# Patient Record
Sex: Female | Born: 1965 | Race: White | Hispanic: No | Marital: Single | Smoking: Current every day smoker
Health system: Southern US, Community
[De-identification: ages and names within clinical notes are randomized; demographics above are authoritative.]

## PROBLEM LIST (undated history)

## (undated) DIAGNOSIS — G8929 Other chronic pain: Secondary | ICD-10-CM

## (undated) DIAGNOSIS — M549 Dorsalgia, unspecified: Secondary | ICD-10-CM

## (undated) DIAGNOSIS — I10 Essential (primary) hypertension: Secondary | ICD-10-CM

## (undated) HISTORY — PX: ABDOMINAL HYSTERECTOMY: SHX81

## (undated) HISTORY — PX: CHOLECYSTECTOMY: SHX55

---

## 1999-04-12 ENCOUNTER — Other Ambulatory Visit: Admission: RE | Admit: 1999-04-12 | Discharge: 1999-04-12 | Payer: Self-pay | Admitting: Obstetrics and Gynecology

## 2009-05-25 ENCOUNTER — Ambulatory Visit (HOSPITAL_COMMUNITY): Admission: RE | Admit: 2009-05-25 | Discharge: 2009-05-25 | Payer: Self-pay | Admitting: Family Medicine

## 2012-10-08 ENCOUNTER — Emergency Department (HOSPITAL_COMMUNITY)
Admission: EM | Admit: 2012-10-08 | Discharge: 2012-10-08 | Disposition: A | Discharge status: Home / Self Care | Payer: Self-pay | Attending: Emergency Medicine | Admitting: Emergency Medicine

## 2012-10-08 ENCOUNTER — Encounter (HOSPITAL_COMMUNITY): Payer: Self-pay | Admitting: *Deleted

## 2012-10-08 DIAGNOSIS — F17200 Nicotine dependence, unspecified, uncomplicated: Secondary | ICD-10-CM | POA: Insufficient documentation

## 2012-10-08 DIAGNOSIS — G8929 Other chronic pain: Secondary | ICD-10-CM | POA: Insufficient documentation

## 2012-10-08 DIAGNOSIS — Z90710 Acquired absence of both cervix and uterus: Secondary | ICD-10-CM | POA: Insufficient documentation

## 2012-10-08 DIAGNOSIS — R1110 Vomiting, unspecified: Secondary | ICD-10-CM

## 2012-10-08 DIAGNOSIS — Z9089 Acquired absence of other organs: Secondary | ICD-10-CM | POA: Insufficient documentation

## 2012-10-08 DIAGNOSIS — R112 Nausea with vomiting, unspecified: Secondary | ICD-10-CM | POA: Insufficient documentation

## 2012-10-08 HISTORY — DX: Dorsalgia, unspecified: M54.9

## 2012-10-08 HISTORY — DX: Other chronic pain: G89.29

## 2012-10-08 LAB — COMPREHENSIVE METABOLIC PANEL
Albumin: 3.2 g/dL — ABNORMAL LOW (ref 3.5–5.2)
BUN: <3 mg/dL — ABNORMAL LOW (ref 6–23)
Calcium: 8.6 mg/dL (ref 8.4–10.5)
Chloride: 100 mEq/L (ref 96–112)
Creatinine, Ser: 0.54 mg/dL (ref 0.50–1.10)
GFR calc non Af Amer: >90 mL/min (ref >90)
Total Bilirubin: 0.4 mg/dL (ref 0.3–1.2)

## 2012-10-08 LAB — CBC WITH DIFFERENTIAL/PLATELET
Basophils Relative: 0 % (ref 0–1)
Eosinophils Absolute: 0.1 10*3/uL (ref 0.0–0.7)
Eosinophils Relative: 1 % (ref 0–5)
HCT: 38.2 % (ref 36.0–46.0)
Hemoglobin: 13.5 g/dL (ref 12.0–15.0)
MCH: 35.1 pg — ABNORMAL HIGH (ref 26.0–34.0)
MCHC: 35.3 g/dL (ref 30.0–36.0)
MCV: 99.2 fL (ref 78.0–100.0)
Monocytes Absolute: 0.6 10*3/uL (ref 0.1–1.0)
Monocytes Relative: 8 % (ref 3–12)
Neutro Abs: 4.5 10*3/uL (ref 1.7–7.7)

## 2012-10-08 LAB — URINALYSIS, ROUTINE W REFLEX MICROSCOPIC
Glucose, UA: NEGATIVE mg/dL
Leukocytes, UA: NEGATIVE
Protein, ur: NEGATIVE mg/dL
Urobilinogen, UA: 0.2 mg/dL (ref 0.0–1.0)

## 2012-10-08 MED ORDER — SODIUM CHLORIDE 0.9 % IV BOLUS (SEPSIS)
1000.0000 mL | Freq: Once | INTRAVENOUS | Status: AC
  Administered 2012-10-08: 1000 mL via INTRAVENOUS

## 2012-10-08 MED ORDER — HYDROMORPHONE HCL PF 1 MG/ML IJ SOLN
1.0000 mg | Freq: Once | INTRAMUSCULAR | Status: AC
  Administered 2012-10-08: 1 mg via INTRAVENOUS
  Filled 2012-10-08: qty 1

## 2012-10-08 MED ORDER — ONDANSETRON HCL 4 MG/2ML IJ SOLN
4.0000 mg | Freq: Once | INTRAMUSCULAR | Status: AC
  Administered 2012-10-08: 4 mg via INTRAVENOUS
  Filled 2012-10-08: qty 2

## 2012-10-08 MED ORDER — POTASSIUM CHLORIDE CRYS ER 20 MEQ PO TBCR
40.0000 meq | EXTENDED_RELEASE_TABLET | Freq: Once | ORAL | Status: AC
  Administered 2012-10-08: 40 meq via ORAL
  Filled 2012-10-08 (×2): qty 2

## 2012-10-08 NOTE — ED Notes (Signed)
Patient request soft drink,nurse notified given upon request.

## 2012-10-08 NOTE — ED Provider Notes (Signed)
History    This chart was scribed for Erika Lennert, MD by Marlyne Beards, ED Scribe. The patient was seen in room APA06/APA06. Patient's care was started at 8:59 PM.    CSN: [id number]  Arrival date & time 10/08/12  2039   First MD Initiated Contact with Patient 10/08/12 2059      No chief complaint on file.   (Consider location/radiation/quality/duration/timing/severity/associated sxs/prior treatment) Patient is a 47 y.o. female presenting with abdominal pain. The history is provided by the patient. No language interpreter was used.  Abdominal Pain Pain location:  RLQ and LLQ Pain severity:  Moderate Onset quality:  Gradual Timing:  Constant Chronicity:  New Relieved by:  Nothing Associated symptoms: nausea and vomiting   Associated symptoms: no chest pain and no diarrhea    Erika Rodriguez is a 47 y.o. female who presents to the Emergency Department complaining of moderate constant nausea with associated episodes of emesis and abdominal pain onset since Saturday. Pt states she went to [facility] last week due to sx's where a CT scan and Ultra sound were done but no diagnosis was found. Pt went back today and was sent here for further evaluation. Pt came in today due to her sx's becoming worse with moderate constant abdominal pain being the chief complaint. Pt states she had multiple episodes of emesis earlier today pta. Pt denies fever, chills, cough, diarrhea, SOB, weakness, and any other associated symptoms. Pt has medical hx of abdominal hysterectomy and cholecystectomy. Pt is a current everyday tobacco smoker (1pack/day).  Past Medical History  Diagnosis Date  . Chronic back pain     Past Surgical History  Procedure Laterality Date  . Abdominal hysterectomy    . Cholecystectomy      History reviewed. No pertinent family history.  History  Substance Use Topics  . Smoking status: Current Every Day Smoker -- 1.00 packs/day  . Smokeless tobacco: Not on file  . Alcohol  Use: Yes     Comment: occasional    OB History   Grav Para Term Preterm Abortions TAB SAB Ect Mult Living                  Review of Systems  Cardiovascular: Negative for chest pain.  Gastrointestinal: Positive for nausea, vomiting and abdominal pain. Negative for diarrhea.    Allergies  Review of patient's allergies indicates no known allergies.  Home Medications  No current outpatient prescriptions on file.  BP 169/90  Pulse 77  Temp(Src) 98.4 F (36.9 C) (Oral)  Resp 20  Ht 5\' 4"  (1.626 m)  Wt 180 lb (81.647 kg)  BMI 30.88 kg/m2  SpO2 100%  Physical Exam  Nursing note and vitals reviewed. Constitutional: She is oriented to person, place, and time. She appears well-developed.  HENT:  Head: Normocephalic.  Eyes: Conjunctivae and EOM are normal. No scleral icterus.  Neck: Neck supple. No thyromegaly present.  Cardiovascular: Normal rate and regular rhythm.  Exam reveals no gallop and no friction rub.   No murmur heard. Pulmonary/Chest: No stridor. She has no wheezes. She has no rales. She exhibits no tenderness.  Abdominal: She exhibits no distension. There is tenderness. There is no rebound.  Mild tenderness to RLQ and LLQ of abdomen.  Musculoskeletal: Normal range of motion. She exhibits no edema.  Lymphadenopathy:    She has no cervical adenopathy.  Neurological: She is oriented to person, place, and time. Coordination normal.  Skin: No rash noted. No erythema.  Psychiatric: She has  a normal mood and affect. Her behavior is normal.    ED Course  Procedures (including critical care time) DIAGNOSTIC STUDIES: Oxygen Saturation is 100% on room air, normal by my interpretation.    COORDINATION OF CARE: 9:05 PM Discussed ED treatment with pt and pt agrees.  10:52 PM Discussed lab results with pt and pt agrees.   Labs Reviewed  CBC WITH DIFFERENTIAL - Abnormal; Notable for the following:    RBC 3.85 (*)    MCH 35.1 (*)    All other components within normal  limits  COMPREHENSIVE METABOLIC PANEL - Abnormal; Notable for the following:    Potassium 3.1 (*)    Glucose, Bld 106 (*)    BUN <3 (*)    Total Protein 5.9 (*)    Albumin 3.2 (*)    All other components within normal limits  URINALYSIS, ROUTINE W REFLEX MICROSCOPIC   No results found.   No diagnosis found.    MDM   The chart was scribed for me under my direct supervision.  I personally performed the history, physical, and medical decision making and all procedures in the evaluation of this patient.Erika Lennert, MD 10/08/12 2258

## 2012-10-08 NOTE — ED Notes (Signed)
Pt reports nausea and vomiting since Saturday with generalized abdominal pain.  Reports was admitted at [facility], discharged yesterday.  Went back today with same symptoms, reports multiple tests done with no specific diagnosis.

## 2012-10-22 ENCOUNTER — Encounter: Payer: Self-pay | Admitting: Gastroenterology

## 2012-10-22 ENCOUNTER — Ambulatory Visit (INDEPENDENT_AMBULATORY_CARE_PROVIDER_SITE_OTHER): Payer: Self-pay | Admitting: Gastroenterology

## 2012-10-22 VITALS — BP 155/105 | HR 97 | Temp 98.4°F | Ht 64.0 in | Wt 183.8 lb

## 2012-10-22 DIAGNOSIS — K921 Melena: Secondary | ICD-10-CM

## 2012-10-22 DIAGNOSIS — R1084 Generalized abdominal pain: Secondary | ICD-10-CM

## 2012-10-22 DIAGNOSIS — K625 Hemorrhage of anus and rectum: Secondary | ICD-10-CM

## 2012-10-22 DIAGNOSIS — R112 Nausea with vomiting, unspecified: Secondary | ICD-10-CM | POA: Insufficient documentation

## 2012-10-22 DIAGNOSIS — G8929 Other chronic pain: Secondary | ICD-10-CM

## 2012-10-22 NOTE — Patient Instructions (Addendum)
We have scheduled you for an upper endoscopy with Dr. Jena Gauss. Please see separate instructions. Take Dexilant one capsule daily before breakfast. Samples provided. Go to ER if you develop fever, worsening abdominal pain, intractable vomiting.

## 2012-10-22 NOTE — Progress Notes (Signed)
No PCP on File 

## 2012-10-22 NOTE — Assessment & Plan Note (Signed)
Recommend colonoscopy at a later date. At this time I do not feel that should be aware tolerate the bowel prep. She is in agreement.

## 2012-10-22 NOTE — Progress Notes (Signed)
Primary Care Physician:  No primary provider on file.  Primary Gastroenterologist:  Roetta Sessions, MD   Chief Complaint  Patient presents with  . Abdominal Pain  . Nausea    HPI:  Erika Rodriguez is a 47 y.o. female here for further evaluation of recent acute onset abdominal pain associated with nausea and vomiting. Symptoms began on 10/04/2012. She was admitted at [facility] from 10/04/2012 to 10/07/2012 for abdominal pain with, N/V. It was felt that she had gastritis. Her LFTs, CBC were unremarkable. Lipase was normal. She also had negative cardiac enzymes x1. CT of the abdomen without contrast showed an old compression fracture at T11 but no acute findings to explain her symptoms. Acute abdominal films were negative. Abdominal ultrasound showed 5.5 mm common bile duct, status post cholecystectomy. Did not see gastroenterologist. After discharge on 10/07/2012, she states she went back to the emergency department the next day and was treated and released. Due to ongoing symptoms she decided to go to [facility] ER when she left the ER [facility].  Still complains of abdominal pain. When symptoms started the pain was in the epigastrium and radiated into chest. She thought she was having a MI. Symptoms have migrated to the lower abdomin and into the back. Vomiting bile and food. No hematemesis. Last episode of vomiting on Saturday. Unable to eat much since then. BM usually 1-2 per week chronically. Sometimes hard. Occasional brbpr. Melena while in the hospital. No ibuprofen prior to going to hospital. Intermittent Aleve for chronic back pain. Prescribed Reglan 10 mg 4 times a day, protonix 40 mg daily daily. Patient stopped taking the medication as she did not feel it was helping.  New problem of being unable to taste or smell.   Intentional 90 pound weight loss, maximum weight of 270 pounds with dietary changes. She does not eat meat.  Current Outpatient Prescriptions  Medication Sig  Dispense Refill  . acetaminophen (TYLENOL) 500 MG tablet Take 500 mg by mouth every 4 (four) hours as needed for pain.      Marland Kitchen ibuprofen (ADVIL,MOTRIN) 400 MG tablet Take 400 mg by mouth every 6 (six) hours as needed for pain.      . hydrOXYzine (ATARAX/VISTARIL) 25 MG tablet Take 25 mg by mouth every 6 (six) hours as needed for anxiety.      . metoCLOPramide (REGLAN) 10 MG tablet Take 10 mg by mouth 4 (four) times daily.      . pantoprazole (PROTONIX) 40 MG tablet Take 40 mg by mouth daily.       No current facility-administered medications for this visit.    Allergies as of 10/22/2012  . (No Known Allergies)    Past Medical History  Diagnosis Date  . Chronic back pain     Past Surgical History  Procedure Laterality Date  . Abdominal hysterectomy    . Cholecystectomy      Family History  Problem Relation Age of Onset  . Ulcers Neg Hx   . Liver disease Neg Hx   . Colon cancer Neg Hx   . Crohn's disease Neg Hx   . Ulcerative colitis Neg Hx     History   Social History  . Marital Status: Single    Spouse Name: N/A    Number of Children: 0  . Years of Education: N/A   Occupational History  . Merchandizing    Social History Main Topics  . Smoking status: Current Every Day Smoker -- 1.00 packs/day  .  Smokeless tobacco: Not on file  . Alcohol Use: Yes     Comment: occasional  . Drug Use: No  . Sexually Active: Not on file   Other Topics Concern  . Not on file   Social History Narrative  . No narrative on file      ROS:  General: 10 pound weight loss with illness. Negative for anorexia, fever, chills, fatigue, weakness. Eyes: Negative for vision changes.  ENT: Negative for hoarseness, difficulty swallowing , nasal congestion. CV: Negative for chest pain, angina, palpitations, dyspnea on exertion, peripheral edema.  Respiratory: Negative for dyspnea at rest, dyspnea on exertion, cough, sputum, wheezing.  GI: See history of present illness. GU:  Negative for  dysuria, hematuria, urinary incontinence, urinary frequency, nocturnal urination.  MS: Negative for joint pain. Chronic low back pain unchanged.  Derm: Negative for rash or itching.  Neuro: Negative for weakness, abnormal sensation, seizure, frequent headaches, memory loss, confusion.  Psych: Negative for anxiety, depression, suicidal ideation, hallucinations.  Endo: Negative for unusual weight change.  Heme: Negative for bruising or bleeding. Allergy: Negative for rash or hives.    Physical Examination:  BP 155/105  Pulse 97  Temp(Src) 98.4 F (36.9 C) (Oral)  Ht 5\' 4"  (1.626 m)  Wt 183 lb 12.8 oz (83.371 kg)  BMI 31.53 kg/m2   General: Well-nourished, well-developed in no acute distress.  Head: Normocephalic, atraumatic.   Eyes: Conjunctiva pink, no icterus. Mouth: Oropharyngeal mucosa moist and pink , no lesions erythema or exudate. Neck: Supple without thyromegaly, masses, or lymphadenopathy.  Lungs: Clear to auscultation bilaterally.  Heart: Regular rate and rhythm, no murmurs rubs or gallops.  Abdomen: Bowel sounds are normal, diffuse mild tenderness, no focal pain, nondistended, no hepatosplenomegaly or masses, no abdominal bruits or    hernia , no rebound or guarding.   Rectal: not performed Extremities: No lower extremity edema. No clubbing or deformities.  Neuro: Alert and oriented x 4 , grossly normal neurologically.  Skin: Warm and dry, no rash or jaundice.   Psych: Alert and cooperative, normal mood and affect.  Labs: Lab Results  Component Value Date   WBC 7.6 10/08/2012   HGB 13.5 10/08/2012   HCT 38.2 10/08/2012   MCV 99.2 10/08/2012   PLT 217 10/08/2012   Lab Results  Component Value Date   CREATININE 0.54 10/08/2012   BUN <3* 10/08/2012   NA 138 10/08/2012   K 3.1* 10/08/2012   CL 100 10/08/2012   CO2 27 10/08/2012   Lab Results  Component Value Date   ALT 20 10/08/2012   AST 14 10/08/2012   ALKPHOS 46 10/08/2012   BILITOT 0.4 10/08/2012   Labs from 10/04/2012. Total  bilirubin 0.5, alkaline phosphatase 48, AST 19, ALT 18, albumin 3.9, amylase 72, lipase 28, creatinine 0.43, white blood cell count 7100, hemoglobin 14.7, hematocrit 42.5, MCV 101.6, platelets 206,000  Imaging Studies: CT of the abdomen without contrast on 10/07/2012 was unremarkable. Abdominal ultrasound 10/06/2012, gallbladder surgically absent. Common bile duct 5.5 mm. Nothing to explain abdominal pain.

## 2012-10-22 NOTE — Addendum Note (Signed)
Addended by: Tiffany Kocher on: 10/22/2012 04:47 PM   Modules accepted: Level of Service

## 2012-10-22 NOTE — Assessment & Plan Note (Signed)
47 year old lady with a several week history of acute onset abdominal pain associated with nausea and vomiting. Hospitalization at [facility] as outlined above with unremarkable noncontrast CT of abdomen and abdominal ultrasound. Labs unrevealing. Seemed to get better with PPI therapy during that hospital stay according to discharge summary. She reports having an episode of melena recently. Continues to have daily abdominal pain, generalized on exam. No vomiting a couple days but unable to eat. Takes Aleve intermittently for chronic pain.   Discussed with patient regarding upper endoscopy given history of melena, persistent vomiting, NSAID use. Need to rule out gastritis, PUD.  I have discussed the risks, alternatives, benefits with regards to but not limited to the risk of reaction to medication, bleeding, infection, perforation and the patient is agreeable to proceed. Written consent to be obtained.  Her abdominal pain is mostly generalized at this point. Likely in part due to prolonged heaving. Doubt we are dealing with evolving appendicitis, given repeatedly normal WBC, afebrile over the past two weeks. If she were to develop worsening abd pain, intractable vomiting, fever she should go to ER.

## 2012-10-23 ENCOUNTER — Encounter (HOSPITAL_COMMUNITY): Payer: Self-pay | Admitting: Pharmacy Technician

## 2012-10-24 NOTE — Progress Notes (Signed)
Forgot to address constipation at OV. Let pt know she should try Miralax 17g daily until regular BMs then daily prn.  She also needs to see PCP for elevated blood pressure.

## 2012-10-29 NOTE — Progress Notes (Signed)
Pt is aware.  

## 2012-11-05 ENCOUNTER — Ambulatory Visit (HOSPITAL_COMMUNITY)
Admission: RE | Admit: 2012-11-05 | Discharge: 2012-11-05 | Disposition: A | Discharge status: Home / Self Care | Payer: Self-pay | Source: Ambulatory Visit | Attending: Internal Medicine | Admitting: Internal Medicine

## 2012-11-05 ENCOUNTER — Encounter (HOSPITAL_COMMUNITY)
Admission: RE | Disposition: A | Discharge status: Home / Self Care | Payer: Self-pay | Source: Ambulatory Visit | Attending: Internal Medicine

## 2012-11-05 ENCOUNTER — Encounter (HOSPITAL_COMMUNITY): Payer: Self-pay | Admitting: *Deleted

## 2012-11-05 DIAGNOSIS — K625 Hemorrhage of anus and rectum: Secondary | ICD-10-CM

## 2012-11-05 DIAGNOSIS — R1084 Generalized abdominal pain: Secondary | ICD-10-CM

## 2012-11-05 DIAGNOSIS — R109 Unspecified abdominal pain: Secondary | ICD-10-CM

## 2012-11-05 DIAGNOSIS — R112 Nausea with vomiting, unspecified: Secondary | ICD-10-CM

## 2012-11-05 DIAGNOSIS — K449 Diaphragmatic hernia without obstruction or gangrene: Secondary | ICD-10-CM

## 2012-11-05 DIAGNOSIS — R110 Nausea: Secondary | ICD-10-CM | POA: Insufficient documentation

## 2012-11-05 DIAGNOSIS — K209 Esophagitis, unspecified without bleeding: Secondary | ICD-10-CM | POA: Insufficient documentation

## 2012-11-05 DIAGNOSIS — K2960 Other gastritis without bleeding: Secondary | ICD-10-CM

## 2012-11-05 DIAGNOSIS — K921 Melena: Secondary | ICD-10-CM

## 2012-11-05 HISTORY — PX: ESOPHAGOGASTRODUODENOSCOPY: SHX5428

## 2012-11-05 LAB — HEPATIC FUNCTION PANEL: Bilirubin, Direct: <0.1 mg/dL (ref 0.0–0.3)

## 2012-11-05 LAB — LIPASE, BLOOD: Lipase: 41 U/L (ref 11–59)

## 2012-11-05 SURGERY — EGD (ESOPHAGOGASTRODUODENOSCOPY)
Anesthesia: Moderate Sedation

## 2012-11-05 MED ORDER — STERILE WATER FOR IRRIGATION IR SOLN
Status: DC | PRN
  Administered 2012-11-05: 14:00:00

## 2012-11-05 MED ORDER — SODIUM CHLORIDE 0.9 % IV SOLN
INTRAVENOUS | Status: DC
  Administered 2012-11-05: 13:00:00 via INTRAVENOUS

## 2012-11-05 MED ORDER — PROMETHAZINE HCL 25 MG/ML IJ SOLN
INTRAMUSCULAR | Status: DC | PRN
  Administered 2012-11-05: 25 mg via INTRAVENOUS

## 2012-11-05 MED ORDER — ONDANSETRON HCL 4 MG/2ML IJ SOLN
INTRAMUSCULAR | Status: AC
  Filled 2012-11-05: qty 2

## 2012-11-05 MED ORDER — MIDAZOLAM HCL 5 MG/5ML IJ SOLN
INTRAMUSCULAR | Status: DC | PRN
  Administered 2012-11-05: 1 mg via INTRAVENOUS
  Administered 2012-11-05 (×3): 2 mg via INTRAVENOUS

## 2012-11-05 MED ORDER — MEPERIDINE HCL 100 MG/ML IJ SOLN
INTRAMUSCULAR | Status: AC
  Filled 2012-11-05: qty 2

## 2012-11-05 MED ORDER — MEPERIDINE HCL 100 MG/ML IJ SOLN
INTRAMUSCULAR | Status: DC | PRN
  Administered 2012-11-05 (×3): 50 mg via INTRAVENOUS

## 2012-11-05 MED ORDER — SODIUM CHLORIDE 0.9 % IJ SOLN
INTRAMUSCULAR | Status: AC
  Filled 2012-11-05: qty 10

## 2012-11-05 MED ORDER — PROMETHAZINE HCL 25 MG/ML IJ SOLN
INTRAMUSCULAR | Status: AC
  Filled 2012-11-05: qty 1

## 2012-11-05 MED ORDER — ONDANSETRON HCL 4 MG/2ML IJ SOLN
INTRAMUSCULAR | Status: DC | PRN
  Administered 2012-11-05: 4 mg via INTRAVENOUS

## 2012-11-05 MED ORDER — MIDAZOLAM HCL 5 MG/5ML IJ SOLN
INTRAMUSCULAR | Status: AC
  Filled 2012-11-05: qty 10

## 2012-11-05 MED ORDER — BUTAMBEN-TETRACAINE-BENZOCAINE 2-2-14 % EX AERO
INHALATION_SPRAY | CUTANEOUS | Status: DC | PRN
  Administered 2012-11-05: 2 via TOPICAL

## 2012-11-05 NOTE — H&P (View-Only) (Signed)
Primary Care Physician:  No primary provider on file.  Primary Gastroenterologist:  Michael Rourk, MD   Chief Complaint  Patient presents with  . Abdominal Pain  . Nausea    HPI:  Erika Rodriguez is a 47 y.o. female here for further evaluation of recent acute onset abdominal pain associated with nausea and vomiting. Symptoms began on 10/04/2012. She was admitted at [facility] from 10/04/2012 to 10/07/2012 for abdominal pain with, N/V. It was felt that she had gastritis. Her LFTs, CBC were unremarkable. Lipase was normal. She also had negative cardiac enzymes x1. CT of the abdomen without contrast showed an old compression fracture at T11 but no acute findings to explain her symptoms. Acute abdominal films were negative. Abdominal ultrasound showed 5.5 mm common bile duct, status post cholecystectomy. Did not see gastroenterologist. After discharge on 10/07/2012, she states she went back to the emergency department the next day and was treated and released. Due to ongoing symptoms she decided to go to [facility] ER when she left the ER [facility].  Still complains of abdominal pain. When symptoms started the pain was in the epigastrium and radiated into chest. She thought she was having a MI. Symptoms have migrated to the lower abdomin and into the back. Vomiting bile and food. No hematemesis. Last episode of vomiting on Saturday. Unable to eat much since then. BM usually 1-2 per week chronically. Sometimes hard. Occasional brbpr. Melena while in the hospital. No ibuprofen prior to going to hospital. Intermittent Aleve for chronic back pain. Prescribed Reglan 10 mg 4 times a day, protonix 40 mg daily daily. Patient stopped taking the medication as she did not feel it was helping.  New problem of being unable to taste or smell.   Intentional 90 pound weight loss, maximum weight of 270 pounds with dietary changes. She does not eat meat.  Current Outpatient Prescriptions  Medication Sig  Dispense Refill  . acetaminophen (TYLENOL) 500 MG tablet Take 500 mg by mouth every 4 (four) hours as needed for pain.      . ibuprofen (ADVIL,MOTRIN) 400 MG tablet Take 400 mg by mouth every 6 (six) hours as needed for pain.      . hydrOXYzine (ATARAX/VISTARIL) 25 MG tablet Take 25 mg by mouth every 6 (six) hours as needed for anxiety.      . metoCLOPramide (REGLAN) 10 MG tablet Take 10 mg by mouth 4 (four) times daily.      . pantoprazole (PROTONIX) 40 MG tablet Take 40 mg by mouth daily.       No current facility-administered medications for this visit.    Allergies as of 10/22/2012  . (No Known Allergies)    Past Medical History  Diagnosis Date  . Chronic back pain     Past Surgical History  Procedure Laterality Date  . Abdominal hysterectomy    . Cholecystectomy      Family History  Problem Relation Age of Onset  . Ulcers Neg Hx   . Liver disease Neg Hx   . Colon cancer Neg Hx   . Crohn's disease Neg Hx   . Ulcerative colitis Neg Hx     History   Social History  . Marital Status: Single    Spouse Name: N/A    Number of Children: 0  . Years of Education: N/A   Occupational History  . Merchandizing    Social History Main Topics  . Smoking status: Current Every Day Smoker -- 1.00 packs/day  .   Smokeless tobacco: Not on file  . Alcohol Use: Yes     Comment: occasional  . Drug Use: No  . Sexually Active: Not on file   Other Topics Concern  . Not on file   Social History Narrative  . No narrative on file      ROS:  General: 10 pound weight loss with illness. Negative for anorexia, fever, chills, fatigue, weakness. Eyes: Negative for vision changes.  ENT: Negative for hoarseness, difficulty swallowing , nasal congestion. CV: Negative for chest pain, angina, palpitations, dyspnea on exertion, peripheral edema.  Respiratory: Negative for dyspnea at rest, dyspnea on exertion, cough, sputum, wheezing.  GI: See history of present illness. GU:  Negative for  dysuria, hematuria, urinary incontinence, urinary frequency, nocturnal urination.  MS: Negative for joint pain. Chronic low back pain unchanged.  Derm: Negative for rash or itching.  Neuro: Negative for weakness, abnormal sensation, seizure, frequent headaches, memory loss, confusion.  Psych: Negative for anxiety, depression, suicidal ideation, hallucinations.  Endo: Negative for unusual weight change.  Heme: Negative for bruising or bleeding. Allergy: Negative for rash or hives.    Physical Examination:  BP 155/105  Pulse 97  Temp(Src) 98.4 F (36.9 C) (Oral)  Ht 5' 4" (1.626 m)  Wt 183 lb 12.8 oz (83.371 kg)  BMI 31.53 kg/m2   General: Well-nourished, well-developed in no acute distress.  Head: Normocephalic, atraumatic.   Eyes: Conjunctiva pink, no icterus. Mouth: Oropharyngeal mucosa moist and pink , no lesions erythema or exudate. Neck: Supple without thyromegaly, masses, or lymphadenopathy.  Lungs: Clear to auscultation bilaterally.  Heart: Regular rate and rhythm, no murmurs rubs or gallops.  Abdomen: Bowel sounds are normal, diffuse mild tenderness, no focal pain, nondistended, no hepatosplenomegaly or masses, no abdominal bruits or    hernia , no rebound or guarding.   Rectal: not performed Extremities: No lower extremity edema. No clubbing or deformities.  Neuro: Alert and oriented x 4 , grossly normal neurologically.  Skin: Warm and dry, no rash or jaundice.   Psych: Alert and cooperative, normal mood and affect.  Labs: Lab Results  Component Value Date   WBC 7.6 10/08/2012   HGB 13.5 10/08/2012   HCT 38.2 10/08/2012   MCV 99.2 10/08/2012   PLT 217 10/08/2012   Lab Results  Component Value Date   CREATININE 0.54 10/08/2012   BUN <3* 10/08/2012   NA 138 10/08/2012   K 3.1* 10/08/2012   CL 100 10/08/2012   CO2 27 10/08/2012   Lab Results  Component Value Date   ALT 20 10/08/2012   AST 14 10/08/2012   ALKPHOS 46 10/08/2012   BILITOT 0.4 10/08/2012   Labs from 10/04/2012. Total  bilirubin 0.5, alkaline phosphatase 48, AST 19, ALT 18, albumin 3.9, amylase 72, lipase 28, creatinine 0.43, white blood cell count 7100, hemoglobin 14.7, hematocrit 42.5, MCV 101.6, platelets 206,000  Imaging Studies: CT of the abdomen without contrast on 10/07/2012 was unremarkable. Abdominal ultrasound 10/06/2012, gallbladder surgically absent. Common bile duct 5.5 mm. Nothing to explain abdominal pain.    

## 2012-11-05 NOTE — Interval H&P Note (Signed)
History and Physical Interval Note:  11/05/2012 2:01 PM  Erika Rodriguez  has presented today for surgery, with the diagnosis of NAUSEA  AND VOMITING RECTAL BLEED ABDOMINAL PAIN MELENA  The various methods of treatment have been discussed with the patient and family. After consideration of risks, benefits and other options for treatment, the patient has consented to  Procedure(s) with comments: ESOPHAGOGASTRODUODENOSCOPY (EGD) (N/A) - 1:30 as a surgical intervention .  The patient's history has been reviewed, patient examined, no change in status, stable for surgery.  I have reviewed the patient's chart and labs.  Questions were answered to the patient's satisfaction.     EGD per plan.The risks, benefits, limitations, alternatives and imponderables have been reviewed with the patient. Potential for esophageal dilation, biopsy, etc. have also been reviewed.  Questions have been answered. All parties agreeable.   [name]

## 2012-11-05 NOTE — Op Note (Signed)
[facility] [address] [place], [address]   ENDOSCOPY PROCEDURE REPORT  PATIENT: Erika Rodriguez, Erika Rodriguez  MR#: [id number] BIRTHDATE: 1966/03/30 , 46  yrs. old GENDER: Female ENDOSCOPIST: R.  Roetta Sessions, MD FACP [facility] REFERRED BY:      none PROCEDURE DATE:  11/05/2012 PROCEDURE:     EGD with esophageal, gastric and duodenal biopsy  INDICATIONS:      abdominal pain  INFORMED CONSENT:   The risks, benefits, limitations, alternatives and imponderables have been discussed.  The potential for biopsy, esophogeal dilation, etc. have also been reviewed.  Questions have been answered.  All parties agreeable.  Please see the history and physical in the medical record for more information.  MEDICATIONS:    Demerol 150 mg IV and Versed 7 mg IV. Phenergan 25 mg IV and Zofran 4 mg IV  DESCRIPTION OF PROCEDURE:   The [id number]-[id number] ([id number])  endoscope was introduced through the mouth and advanced to the second portion of the duodenum without difficulty or limitations.  The mucosal surfaces were surveyed very carefully during advancement of the scope and upon withdrawal.  Retroflexion view of the proximal stomach and esophagogastric junction was performed.      FINDINGS:  2 cm skinny "tongue" of salmon -colored epithelium- distal esophagus; otherwise, esophageal mucosa appeared normal. Stomach empty. Subtle Mottling of the Gastric Mucosa with Focal Superficial Antral Erosions. No Ulcer or Infiltrating Process. Small Hiatal Hernia. Patent Pylorus. Examination of Bulb, Second and Third Portion Revealed some injection of the mucosa and fine erosions of the duodenal mucosa as well.. No Scalloping.  THERAPEUTIC / DIAGNOSTIC MANEUVERS PERFORMED:  Biopsies the abnormal-appearing esophagus, stomach and duodenum were taken for histologic study   COMPLICATIONS:  None  IMPRESSION:  Hiatal hernia. Subtle mucosal abdomen obese of the esophagus, stomach and duodenum-of uncertain  significance-status post biopsies   RECOMMENDATIONS:   Followup on pathology. Serum lipase and repeat hepatic function profile today    _______________________________ R. Roetta Sessions, MD FACP [facility] eSigned:  R. Roetta Sessions, MD FACP [facility] 11/05/2012 2:42 PM     CC:  PATIENT NAME:  Erika Rodriguez, Erika Rodriguez MR#: [id number]

## 2012-11-09 ENCOUNTER — Encounter: Payer: Self-pay | Admitting: Internal Medicine

## 2012-11-10 ENCOUNTER — Encounter (HOSPITAL_COMMUNITY): Payer: Self-pay | Admitting: Internal Medicine

## 2012-11-10 NOTE — Progress Notes (Unsigned)
Letter from: Corbin Ade  Reason for Letter: Results Review Send letter to patient.  Send copy of letter with path to referring provider and PCP.  Nee ov w extender in 10-12 weeks to set up tcs

## 2012-11-10 NOTE — Progress Notes (Unsigned)
Letter mailed to pt.  

## 2012-11-12 LAB — CBC
ALT: 18 U/L (ref 7–35)
AST: 19 U/L
Alkaline Phosphatase: 48 U/L
Amylase: 72 units/L (ref 25–110)
Glucose: 114

## 2012-11-12 LAB — LIPASE
HCT: 43 %
MCV: 1001.6 fL

## 2012-11-20 NOTE — Progress Notes (Signed)
Pt is aware of OV on 7/22 at 0945 with AS

## 2012-11-21 ENCOUNTER — Encounter: Payer: Self-pay | Admitting: Gastroenterology

## 2013-01-19 ENCOUNTER — Encounter: Payer: Self-pay | Admitting: Internal Medicine

## 2013-01-20 ENCOUNTER — Telehealth: Payer: Self-pay | Admitting: Gastroenterology

## 2013-01-20 ENCOUNTER — Ambulatory Visit: Payer: Self-pay | Admitting: Gastroenterology

## 2013-01-20 NOTE — Telephone Encounter (Signed)
Pt was a no show

## 2013-01-26 ENCOUNTER — Encounter: Payer: Self-pay | Admitting: Gastroenterology

## 2013-01-26 NOTE — Telephone Encounter (Signed)
Please send note for f/u 

## 2013-01-26 NOTE — Telephone Encounter (Signed)
Mailed letter for pt to call our office to [facility] her OV

## 2013-02-12 ENCOUNTER — Emergency Department (HOSPITAL_COMMUNITY)
Admission: EM | Admit: 2013-02-12 | Discharge: 2013-02-12 | Disposition: A | Discharge status: Home / Self Care | Payer: Self-pay | Attending: Emergency Medicine | Admitting: Emergency Medicine

## 2013-02-12 ENCOUNTER — Encounter (HOSPITAL_COMMUNITY): Payer: Self-pay | Admitting: Emergency Medicine

## 2013-02-12 DIAGNOSIS — R209 Unspecified disturbances of skin sensation: Secondary | ICD-10-CM | POA: Insufficient documentation

## 2013-02-12 DIAGNOSIS — M549 Dorsalgia, unspecified: Secondary | ICD-10-CM

## 2013-02-12 DIAGNOSIS — I10 Essential (primary) hypertension: Secondary | ICD-10-CM | POA: Insufficient documentation

## 2013-02-12 DIAGNOSIS — G8929 Other chronic pain: Secondary | ICD-10-CM | POA: Insufficient documentation

## 2013-02-12 DIAGNOSIS — M545 Low back pain, unspecified: Secondary | ICD-10-CM | POA: Insufficient documentation

## 2013-02-12 DIAGNOSIS — M5430 Sciatica, unspecified side: Secondary | ICD-10-CM | POA: Insufficient documentation

## 2013-02-12 DIAGNOSIS — F17200 Nicotine dependence, unspecified, uncomplicated: Secondary | ICD-10-CM | POA: Insufficient documentation

## 2013-02-12 HISTORY — DX: Essential (primary) hypertension: I10

## 2013-02-12 MED ORDER — HYDROCODONE-ACETAMINOPHEN 5-325 MG PO TABS: 2.0000 | ORAL_TABLET | ORAL | Status: DC | PRN

## 2013-02-12 MED ORDER — DEXAMETHASONE SODIUM PHOSPHATE 10 MG/ML IJ SOLN
10.0000 mg | Freq: Once | INTRAMUSCULAR | Status: AC
  Administered 2013-02-12: 10 mg via INTRAMUSCULAR
  Filled 2013-02-12: qty 1

## 2013-02-12 MED ORDER — KETOROLAC TROMETHAMINE 60 MG/2ML IM SOLN
60.0000 mg | Freq: Once | INTRAMUSCULAR | Status: AC
  Administered 2013-02-12: 60 mg via INTRAMUSCULAR
  Filled 2013-02-12: qty 2

## 2013-02-12 MED ORDER — NAPROXEN 500 MG PO TABS: 500.0000 mg | ORAL_TABLET | Freq: Two times a day (BID) | ORAL | Status: DC

## 2013-02-12 NOTE — ED Provider Notes (Signed)
CSN: [id number]     Arrival date & time 02/12/13  0840 History  This chart was scribed for Vida Roller, MD by Bennett Scrape, ED Scribe. This patient was seen in room APA04/APA04 and the patient's care was started at 9:08 AM.   Chief Complaint  Patient presents with  . Back Pain  . Numbness    The history is provided by the patient. No language interpreter was used.    HPI Comments: Erika Rodriguez is a 47 y.o. female who presents to the Emergency Department complaining of 20 years of intermittent lower back pain that has started radiating down bilateral posterior legs over the past 3 months. She states that this flare up started around 4 AM this morning when she got up awkwardly. She reports that the last episode this severe occurred 6 months ago. The pain is located across the diffuse lower back and described as a stabbing sensation that again radiates down bilateral posterior legs. She reports intermittent numbness in bilateral legs with ambulation for the past 3 months as well and denies changes. She states that she usually takes Aleve for her symptoms but has not taken anything today. She denies having any prior back surgeries. She denies fever, difficulty urinating, urinary frequency and bowel or urinary incontinence as associated symptoms. She denies illegal drug use or h/o CA. Pt states that she was told that she had HTN during her last ED visit but has not followed up since.    Past Medical History  Diagnosis Date  . Chronic back pain   . Hypertension    Past Surgical History  Procedure Laterality Date  . Abdominal hysterectomy    . Cholecystectomy    . Esophagogastroduodenoscopy N/A 11/05/2012    [id number] hernia. Subtle mucosal abdomen obese of the esophagus, stomach and duodenum-of uncertain significance-s/p bx   Family History  Problem Relation Age of Onset  . Ulcers Neg Hx   . Liver disease Neg Hx   . Colon cancer Neg Hx   . Crohn's disease Neg Hx   . Ulcerative  colitis Neg Hx   . Diabetes Mother   . Heart failure Mother   . Stroke Sister   . Epilepsy Sister    History  Substance Use Topics  . Smoking status: Current Every Day Smoker -- 0.50 packs/day for 20 years    Types: Cigarettes  . Smokeless tobacco: Never Used  . Alcohol Use: No   OB History   Grav Para Term Preterm Abortions TAB SAB Ect Mult Living   1    1  1    0     Review of Systems  A complete 10 system review of systems was obtained and all systems are negative except as noted in the HPI and PMH.   Allergies  Review of patient's allergies indicates no known allergies.  Home Medications   Current Outpatient Rx  Name  Route  Sig  Dispense  Refill  . acetaminophen (TYLENOL) 500 MG tablet   Oral   Take 500 mg by mouth every 4 (four) hours as needed for pain.         Marland Kitchen ibuprofen (ADVIL,MOTRIN) 400 MG tablet   Oral   Take 400 mg by mouth every 6 (six) hours as needed for pain.         Marland Kitchen HYDROcodone-acetaminophen (NORCO/VICODIN) 5-325 MG per tablet   Oral   Take 2 tablets by mouth every 4 (four) hours as needed for pain.   20 tablet  0   . naproxen (NAPROSYN) 500 MG tablet   Oral   Take 1 tablet (500 mg total) by mouth 2 (two) times daily with a meal.   30 tablet   0    Triage Vitals: BP 131/93  Pulse 94  Temp(Src) 98.4 F (36.9 C) (Oral)  Resp 20  Ht 5\' 3"  (1.6 m)  Wt 183 lb (83.008 kg)  BMI 32.43 kg/m2  SpO2 99%  Physical Exam  Nursing note and vitals reviewed. Constitutional: She is oriented to person, place, and time. She appears well-developed and well-nourished. No distress.  HENT:  Head: Normocephalic and atraumatic.  Eyes: EOM are normal.  Neck: Neck supple. No tracheal deviation present.  Cardiovascular: Normal rate and regular rhythm.   Pulmonary/Chest: Effort normal and breath sounds normal. No respiratory distress.  Musculoskeletal: Normal range of motion.  Passive SLR bilaterally reproduces pain at 45 degrees of hip flexion -  relieved with knee flexion  Neurological: She is alert and oriented to person, place, and time. She has normal reflexes.  Sensation to light touch and pin prick was intact grossly, subjective asymmetry on the left, 5/5 strength in bilateral lower extremities and hips, appropriate and normal patellar reflexes bilaterally  Skin: Skin is warm and dry.  Psychiatric: She has a normal mood and affect. Her behavior is normal.    ED Course   DIAGNOSTIC STUDIES: Oxygen Saturation is 99% on room air, normal by my interpretation.    COORDINATION OF CARE: 9:14 AM-Discussed discharge plan which includes pain medications that are safe to drive with and prescriptions for stronger pain medications with pt at bedside and pt agreed to plan. Advised pt that she needs to f/u with PCP for HTN and back specialist and pt agreed. Addressed symptoms to return for.  Procedures (including critical care time)  Labs Reviewed - No data to display No results found. 1. Back pain   2. Sciatica, unspecified laterality     MDM  Though the pt is in pain, she has no focal neurological defecits and has been able to drive herself to the ED and ambulate into the hospital, she has no numbness and can straight leg raise though passive straight leg raise does cause pain bilaterally - this raises some concern for disc disease and herniation however she at this point is amenable to conservative therapy with steroid / Toradol and hydrocodone / apap as outpt with NS f/u.  Her VS show no fevers and she has no other pathological red flags for back pain.  She has no urinary incontinence, no fevers, no IVDU, no CA history.    She has expressed her understanding and indications for f/u.  Meds given in ED:  Medications  ketorolac (TORADOL) injection 60 mg (not administered)  dexamethasone (DECADRON) injection 10 mg (not administered)    New Prescriptions   HYDROCODONE-ACETAMINOPHEN (NORCO/VICODIN) 5-325 MG PER TABLET    Take 2  tablets by mouth every 4 (four) hours as needed for pain.   NAPROXEN (NAPROSYN) 500 MG TABLET    Take 1 tablet (500 mg total) by mouth 2 (two) times daily with a meal.    I personally performed the services described in this documentation, which was scribed in my presence. The recorded information has been reviewed and is accurate.    Vida Roller, MD 02/12/13 [phone]

## 2013-02-12 NOTE — Progress Notes (Signed)
ED/CM noted pt did not have health insurance, and/or PCP. Patient was given the ED [place] uninsured handout with information for the clinics, food pantries, and the handout for insurance sign-up. Patient expressed appreciation for this.      

## 2013-02-12 NOTE — ED Notes (Signed)
Patient c/o lower back pain with numbness and tingling in both legs. Per patient also lost taste and smell. Patient reports injuring back 27 years ago at work and having "flare-ups" with back pain since. Patient states she woke with "her back out" this morning. Patient reports numbness and tingling in legs with loss of smell and taste starting 3 months ago. Patient also states "now to think of it my eye sight is getting blurry to."

## 2013-02-12 NOTE — ED Notes (Signed)
Pt states prior to receiving toradol shot that toradol doesn't usually help her.  Pt tearful, rates pain at 10.  Notified Dr. Hyacinth Meeker but no further orders at this time.

## 2013-03-04 ENCOUNTER — Emergency Department (HOSPITAL_COMMUNITY)
Admission: EM | Admit: 2013-03-04 | Discharge: 2013-03-04 | Disposition: A | Discharge status: Home / Self Care | Payer: Self-pay | Attending: Emergency Medicine | Admitting: Emergency Medicine

## 2013-03-04 ENCOUNTER — Encounter (HOSPITAL_COMMUNITY): Payer: Self-pay | Admitting: *Deleted

## 2013-03-04 DIAGNOSIS — I10 Essential (primary) hypertension: Secondary | ICD-10-CM | POA: Insufficient documentation

## 2013-03-04 DIAGNOSIS — R55 Syncope and collapse: Secondary | ICD-10-CM | POA: Insufficient documentation

## 2013-03-04 DIAGNOSIS — M545 Low back pain, unspecified: Secondary | ICD-10-CM | POA: Insufficient documentation

## 2013-03-04 DIAGNOSIS — F17200 Nicotine dependence, unspecified, uncomplicated: Secondary | ICD-10-CM | POA: Insufficient documentation

## 2013-03-04 DIAGNOSIS — M79609 Pain in unspecified limb: Secondary | ICD-10-CM | POA: Insufficient documentation

## 2013-03-04 DIAGNOSIS — Z79899 Other long term (current) drug therapy: Secondary | ICD-10-CM | POA: Insufficient documentation

## 2013-03-04 DIAGNOSIS — R110 Nausea: Secondary | ICD-10-CM | POA: Insufficient documentation

## 2013-03-04 DIAGNOSIS — G8929 Other chronic pain: Secondary | ICD-10-CM | POA: Insufficient documentation

## 2013-03-04 LAB — URINALYSIS, ROUTINE W REFLEX MICROSCOPIC
Ketones, ur: NEGATIVE mg/dL
Leukocytes, UA: NEGATIVE
Nitrite: NEGATIVE
Protein, ur: NEGATIVE mg/dL
Urobilinogen, UA: 0.2 mg/dL (ref 0.0–1.0)

## 2013-03-04 LAB — GLUCOSE, CAPILLARY: Glucose-Capillary: 109 mg/dL — ABNORMAL HIGH (ref 70–99)

## 2013-03-04 MED ORDER — NAPROXEN 500 MG PO TABS: 500.0000 mg | ORAL_TABLET | Freq: Two times a day (BID) | ORAL | Status: DC

## 2013-03-04 MED ORDER — HYDROMORPHONE HCL PF 2 MG/ML IJ SOLN
2.0000 mg | Freq: Once | INTRAMUSCULAR | Status: AC
  Administered 2013-03-04: 2 mg via INTRAMUSCULAR
  Filled 2013-03-04: qty 1

## 2013-03-04 MED ORDER — KETOROLAC TROMETHAMINE 60 MG/2ML IM SOLN
60.0000 mg | Freq: Once | INTRAMUSCULAR | Status: AC
  Administered 2013-03-04: 60 mg via INTRAMUSCULAR
  Filled 2013-03-04: qty 2

## 2013-03-04 MED ORDER — HYDROCODONE-ACETAMINOPHEN 5-325 MG PO TABS: 1.0000 | ORAL_TABLET | ORAL | Status: DC | PRN

## 2013-03-04 MED ORDER — ONDANSETRON 4 MG PO TBDP
4.0000 mg | ORAL_TABLET | Freq: Once | ORAL | Status: AC
  Administered 2013-03-04: 4 mg via ORAL
  Filled 2013-03-04: qty 1

## 2013-03-04 NOTE — ED Provider Notes (Signed)
CSN: [id number]     Arrival date & time 03/04/13  1025 History   First MD Initiated Contact with Patient 03/04/13 1152     Chief Complaint  Patient presents with  . Back Pain  . Leg Pain  . Nausea  . Near Syncope   (Consider location/radiation/quality/duration/timing/severity/associated sxs/prior Treatment) HPI Pt with hx of chronic back pain presents with worsening of her low back pain in left lower back.  Some radiation down her left leg.  No numbness or weakness of lower leg.  Pt states that the medications she was given several weeks ago for similar pain did help and that her back was improving until the past several days.  Denies having difficulty urinating, no incontinence of bowel or bladder, no fevers.  No specific injury or trauma.  However she has been helping a family member that is in rehab at this time. Movement and palpation make symptoms worse.   There are no other associated systemic symptoms, there are no other alleviating or modifying factors.   Past Medical History  Diagnosis Date  . Chronic back pain   . Hypertension    Past Surgical History  Procedure Laterality Date  . Abdominal hysterectomy    . Cholecystectomy    . Esophagogastroduodenoscopy N/A 11/05/2012    [id number] hernia. Subtle mucosal abdomen obese of the esophagus, stomach and duodenum-of uncertain significance-s/p bx   Family History  Problem Relation Age of Onset  . Ulcers Neg Hx   . Liver disease Neg Hx   . Colon cancer Neg Hx   . Crohn's disease Neg Hx   . Ulcerative colitis Neg Hx   . Diabetes Mother   . Heart failure Mother   . Stroke Sister   . Epilepsy Sister    History  Substance Use Topics  . Smoking status: Current Every Day Smoker -- 0.50 packs/day for 20 years    Types: Cigarettes  . Smokeless tobacco: Never Used  . Alcohol Use: No   OB History   Grav Para Term Preterm Abortions TAB SAB Ect Mult Living   1    1  1    0     Review of Systems ROS reviewed and all otherwise  negative except for mentioned in HPI  Allergies  Review of patient's allergies indicates no known allergies.  Home Medications   Current Outpatient Rx  Name  Route  Sig  Dispense  Refill  . ibuprofen (ADVIL,MOTRIN) 400 MG tablet   Oral   Take 400 mg by mouth every 6 (six) hours as needed for pain.         . naproxen sodium (ANAPROX) 220 MG tablet   Oral   Take 440 mg by mouth 2 (two) times daily as needed (for pain).          . Probiotic Product (PROBIOTIC DAILY PO)   Oral   Take 1 capsule by mouth daily.         Marland Kitchen HYDROcodone-acetaminophen (NORCO/VICODIN) 5-325 MG per tablet   Oral   Take 1 tablet by mouth every 4 (four) hours as needed for pain.   15 tablet   0   . naproxen (NAPROSYN) 500 MG tablet   Oral   Take 1 tablet (500 mg total) by mouth 2 (two) times daily.   30 tablet   0    BP 139/94  Pulse 78  Temp(Src) 97.1 F (36.2 C) (Oral)  Resp 18  SpO2 100% Vitals reviewed Physical Exam Physical Examination: General appearance -  alert, well appearing, and in no distress Mental status - alert, oriented to person, place, and time Eyes - no conjunctival injection, no scleral icterus Chest - clear to auscultation, no wheezes, rales or rhonchi, symmetric air entry Heart - normal rate, regular rhythm, normal S1, S2, no murmurs, rubs, clicks or gallops Abdomen - soft, nontender, nondistended, no masses or organomegaly Back exam - full range of motion, no midline tenderness to palpation, some right sided paraspinous tenderness, pain reproduced with straight leg raise and 45 degrees, but no radiation down left lower extremity Neurological - alert, oriented, normal speech, strength 5/5 in extremities x 4, sensation intact Extremities - peripheral pulses normal, no pedal edema, no clubbing or cyanosis Skin - normal coloration and turgor, no rashes  ED Course  Procedures (including critical care time) Labs Review Labs Reviewed  GLUCOSE, CAPILLARY - Abnormal;  Notable for the following:    Glucose-Capillary 109 (*)    All other components within normal limits  URINALYSIS, ROUTINE W REFLEX MICROSCOPIC - Abnormal; Notable for the following:    APPearance HAZY (*)    All other components within normal limits   Imaging Review No results found.  MDM   1. Low back pain    Pt presenting with an increase in her chronic low back pain.  She denies any new injury.  No signs or symptoms of cauda equina or epidural abscess.  Pt requests " a shot and get me out of here".  She has been working on getting f/u appointment with Dr. Newell Coral, neurosurgery.  Pt felt improved after meds in the ED and was anxious for discharge.  Discharged with strict return precautions.  Pt agreeable with plan.    Ethelda Chick, MD 03/04/13 [phone]

## 2013-03-04 NOTE — ED Notes (Signed)
Pt has had lower back problems for a while and today with pain radiating down leg.  Pt states she is nauseated and feels like she is going to pass out.

## 2013-03-04 NOTE — ED Notes (Signed)
Pt reports lower abdominal pain too

## 2013-03-04 NOTE — ED Notes (Signed)
Pt reports that she was driving her sister to rehab when she felt her "back go out." Pt reports that she started having "tingling and pain" in pt left leg.  Pt reports that she was at [facility] a month ago and was given "two shots" and a referral. Pt has appt with Dr. Bettina Gavia. Pt axo X4, NAD noted at this time.

## 2013-03-11 ENCOUNTER — Emergency Department (HOSPITAL_COMMUNITY)
Admission: EM | Admit: 2013-03-11 | Discharge: 2013-03-11 | Disposition: A | Discharge status: Home / Self Care | Payer: Self-pay | Attending: Emergency Medicine | Admitting: Emergency Medicine

## 2013-03-11 ENCOUNTER — Encounter (HOSPITAL_COMMUNITY): Payer: Self-pay

## 2013-03-11 DIAGNOSIS — K5900 Constipation, unspecified: Secondary | ICD-10-CM | POA: Insufficient documentation

## 2013-03-11 DIAGNOSIS — G8929 Other chronic pain: Secondary | ICD-10-CM

## 2013-03-11 DIAGNOSIS — F17200 Nicotine dependence, unspecified, uncomplicated: Secondary | ICD-10-CM | POA: Insufficient documentation

## 2013-03-11 DIAGNOSIS — Y9389 Activity, other specified: Secondary | ICD-10-CM | POA: Insufficient documentation

## 2013-03-11 DIAGNOSIS — IMO0002 Reserved for concepts with insufficient information to code with codable children: Secondary | ICD-10-CM | POA: Insufficient documentation

## 2013-03-11 DIAGNOSIS — I10 Essential (primary) hypertension: Secondary | ICD-10-CM | POA: Insufficient documentation

## 2013-03-11 DIAGNOSIS — Y9289 Other specified places as the place of occurrence of the external cause: Secondary | ICD-10-CM | POA: Insufficient documentation

## 2013-03-11 DIAGNOSIS — X500XXA Overexertion from strenuous movement or load, initial encounter: Secondary | ICD-10-CM | POA: Insufficient documentation

## 2013-03-11 DIAGNOSIS — Z79899 Other long term (current) drug therapy: Secondary | ICD-10-CM | POA: Insufficient documentation

## 2013-03-11 MED ORDER — CYCLOBENZAPRINE HCL 10 MG PO TABS: 10.0000 mg | ORAL_TABLET | Freq: Three times a day (TID) | ORAL | Status: DC | PRN

## 2013-03-11 MED ORDER — NAPROXEN 500 MG PO TABS: 500.0000 mg | ORAL_TABLET | Freq: Two times a day (BID) | ORAL | Status: DC

## 2013-03-11 MED ORDER — DIAZEPAM 5 MG/ML IJ SOLN
10.0000 mg | Freq: Once | INTRAMUSCULAR | Status: AC
  Administered 2013-03-11: 10 mg via INTRAMUSCULAR
  Filled 2013-03-11: qty 2

## 2013-03-11 MED ORDER — TRAMADOL HCL 50 MG PO TABS: 100.0000 mg | ORAL_TABLET | Freq: Four times a day (QID) | ORAL | Status: DC | PRN

## 2013-03-11 MED ORDER — KETOROLAC TROMETHAMINE 60 MG/2ML IM SOLN
60.0000 mg | Freq: Once | INTRAMUSCULAR | Status: AC
  Administered 2013-03-11: 60 mg via INTRAMUSCULAR
  Filled 2013-03-11: qty 2

## 2013-03-11 NOTE — ED Notes (Signed)
Pt reports back pain for 20 years, re-injured her back yesterday helping her sister in the bathtub, waiting on appointment w/ ortho md, is out of pain meds.

## 2013-03-11 NOTE — ED Notes (Signed)
Patient states ride will be here at 1030. Patient understands that RN must see ride prior to discharge.

## 2013-03-11 NOTE — ED Notes (Signed)
Ambulatory to room, steady gait. No distress.

## 2013-03-11 NOTE — ED Notes (Signed)
Patient with no complaints at this time. Respirations even and unlabored. Skin warm/dry. Discharge instructions reviewed with patient at this time. Patient given opportunity to voice concerns/ask questions. Patient discharged at this time and left Emergency Department with steady gait with family member at side

## 2013-03-11 NOTE — ED Provider Notes (Signed)
CSN: [id number]     Arrival date & time 03/11/13  4098 History  This chart was scribed for Ward Givens, MD by Quintella Reichert, ED scribe.  This patient was seen in room APA10/APA10 and the patient's care was started at 9:06 AM.    Chief Complaint  Patient presents with  . Back Pain    The history is provided by the patient. No language interpreter was used.    HPI Comments: Erika Rodriguez is a 47 y.o. female who presents to the Emergency Department complaining of sudden acute exacerbation of her chronic back pain.  Pt states she has had back pain intermittently for 20 years.  Yesterday while she was trying to lift her sister who had a stroke on Aug 3rd out of the bath tub yesterday she suddenly developed severe pain to her left lower back that has grown worse since onset.  Pain radiates into her posterior upper left leg.  It is exacerbated by walking and bending over and is relieved by staying still.  She has attempted to treat pain with Aleve, without relief.  She called neurosurgeon (Dr. Newell Coral) but has not been called back yet.  Pt also reports chronic unchanged constipation.  She denies urinary or bowel incontinence.  Pt states that her last back pain flare-up was 3 years ago.  She has no h/o back surgery.  Pt is a current half-pack-per-day smoker and former one-pack-per-day smoker.  She is not working presently and is not on disability.  She is not married and stays with her sister and her husband.  Her sister had a sroke on 02/01/13 and pt has been taking care of her since then.  Pt has no PCP   Past Medical History  Diagnosis Date  . Chronic back pain   . Hypertension     Past Surgical History  Procedure Laterality Date  . Abdominal hysterectomy    . Cholecystectomy    . Esophagogastroduodenoscopy N/A 11/05/2012    [id number] hernia. Subtle mucosal abdomen obese of the esophagus, stomach and duodenum-of uncertain significance-s/p bx    Family History  Problem Relation Age of  Onset  . Ulcers Neg Hx   . Liver disease Neg Hx   . Colon cancer Neg Hx   . Crohn's disease Neg Hx   . Ulcerative colitis Neg Hx   . Diabetes Mother   . Heart failure Mother   . Stroke Sister   . Epilepsy Sister     History  Substance Use Topics  . Smoking status: Current Every Day Smoker -- 0.50 packs/day for 20 years    Types: Cigarettes  . Smokeless tobacco: Never Used  . Alcohol Use: No  unemployed Down to 1/2 ppd from 1 ppd  OB History   Grav Para Term Preterm Abortions TAB SAB Ect Mult Living   1    1  1    0      Review of Systems  Gastrointestinal: Positive for constipation (chronic, at baseline). Negative for diarrhea.  Genitourinary:       No incontinence  Musculoskeletal: Positive for back pain.     Allergies  Review of patient's allergies indicates no known allergies.  Home Medications   Current Outpatient Rx  Name  Route  Sig  Dispense  Refill  . HYDROcodone-acetaminophen (NORCO/VICODIN) 5-325 MG per tablet   Oral   Take 1 tablet by mouth every 4 (four) hours as needed for pain.   15 tablet   0   .  naproxen sodium (ANAPROX) 220 MG tablet   Oral   Take 440 mg by mouth 2 (two) times daily as needed (for pain).          . Probiotic Product (PROBIOTIC DAILY PO)   Oral   Take 1 capsule by mouth daily.          BP 153/93  Pulse 92  Temp(Src) 98 F (36.7 C) (Oral)  Resp 22  Ht 5\' 4"  (1.626 m)  Wt 183 lb (83.008 kg)  BMI 31.4 kg/m2  SpO2 99%  Physical Exam  Nursing note and vitals reviewed. Constitutional: She is oriented to person, place, and time. She appears well-developed and well-nourished.  Non-toxic appearance. She does not appear ill. No distress.  HENT:  Head: Normocephalic and atraumatic.  Right Ear: External ear normal.  Left Ear: External ear normal.  Nose: Nose normal. No mucosal edema or rhinorrhea.  Mouth/Throat: Oropharynx is clear and moist and mucous membranes are normal. No dental abscesses or edematous.  Eyes:  Conjunctivae and EOM are normal. Pupils are equal, round, and reactive to light.  Neck: Normal range of motion and full passive range of motion without pain. Neck supple.  Cardiovascular: Normal rate, regular rhythm and normal heart sounds.  Exam reveals no gallop and no friction rub.   No murmur heard. Pulmonary/Chest: Effort normal and breath sounds normal. No respiratory distress. She has no wheezes. She has no rhonchi. She has no rales. She exhibits no tenderness and no crepitus.  Abdominal: Soft. Normal appearance and bowel sounds are normal. She exhibits no distension. There is no tenderness. There is no rebound and no guarding.  Musculoskeletal: She exhibits tenderness. She exhibits no edema.       Lumbar back: She exhibits tenderness.       Back:  Tender diffusely to entire lumbar spine. Lumbar paraspinal pain with ROM to left, more severe on left than the right  Neurological: She is alert and oriented to person, place, and time. She has normal strength and normal reflexes. No cranial nerve deficit.  Patellar Reflexes 2+ and equal Straight leg raise negative bilaterally  Skin: Skin is warm, dry and intact. No rash noted. No erythema. No pallor.  Psychiatric: She has a normal mood and affect. Her speech is normal and behavior is normal. Her mood appears not anxious.    ED Course  Procedures (including critical care time) Medications  ketorolac (TORADOL) injection 60 mg (60 mg Intramuscular Given 03/11/13 0920)  diazepam (VALIUM) injection 10 mg (10 mg Intramuscular Given 03/11/13 0920)     DIAGNOSTIC STUDIES: Oxygen Saturation is 99% on room air, normal by my interpretation.    COORDINATION OF CARE: 9:15 AM-Discussed treatment plan which includes pain medication and f/u with pt at bedside and pt agreed to plan.   This is her 3rd ED visit for her back pain since Aug 14th. Review of the [facility] [place] data base only shows narcotic prescriptions from the ED for those visits.   MDM   1.  Acute exacerbation of chronic low back pain    New Prescriptions   CYCLOBENZAPRINE (FLEXERIL) 10 MG TABLET    Take 1 tablet (10 mg total) by mouth 3 (three) times daily as needed for muscle spasms.   NAPROXEN (NAPROSYN) 500 MG TABLET    Take 1 tablet (500 mg total) by mouth 2 (two) times daily.   TRAMADOL (ULTRAM) 50 MG TABLET    Take 2 tablets (100 mg total) by mouth every 6 (six) hours as  needed.    Plan discharge   Devoria Albe, MD, FACEP   I personally performed the services described in this documentation, which was scribed in my presence. The recorded information has been reviewed and considered.  Devoria Albe, MD, FACEP   Ward Givens, MD 03/11/13 [phone]

## 2013-05-07 ENCOUNTER — Other Ambulatory Visit: Payer: Self-pay

## 2013-10-12 ENCOUNTER — Encounter (HOSPITAL_COMMUNITY): Payer: Self-pay | Admitting: Emergency Medicine

## 2013-10-12 ENCOUNTER — Emergency Department (HOSPITAL_COMMUNITY): Payer: Self-pay

## 2013-10-12 ENCOUNTER — Emergency Department (HOSPITAL_COMMUNITY)
Admission: EM | Admit: 2013-10-12 | Discharge: 2013-10-12 | Disposition: A | Discharge status: Home / Self Care | Payer: Self-pay | Attending: Emergency Medicine | Admitting: Emergency Medicine

## 2013-10-12 DIAGNOSIS — L0390 Cellulitis, unspecified: Secondary | ICD-10-CM

## 2013-10-12 DIAGNOSIS — Y939 Activity, unspecified: Secondary | ICD-10-CM | POA: Insufficient documentation

## 2013-10-12 DIAGNOSIS — R112 Nausea with vomiting, unspecified: Secondary | ICD-10-CM | POA: Insufficient documentation

## 2013-10-12 DIAGNOSIS — F17200 Nicotine dependence, unspecified, uncomplicated: Secondary | ICD-10-CM | POA: Insufficient documentation

## 2013-10-12 DIAGNOSIS — R1110 Vomiting, unspecified: Secondary | ICD-10-CM

## 2013-10-12 DIAGNOSIS — Z90710 Acquired absence of both cervix and uterus: Secondary | ICD-10-CM | POA: Insufficient documentation

## 2013-10-12 DIAGNOSIS — Y929 Unspecified place or not applicable: Secondary | ICD-10-CM | POA: Insufficient documentation

## 2013-10-12 DIAGNOSIS — R638 Other symptoms and signs concerning food and fluid intake: Secondary | ICD-10-CM | POA: Insufficient documentation

## 2013-10-12 DIAGNOSIS — Z79899 Other long term (current) drug therapy: Secondary | ICD-10-CM | POA: Insufficient documentation

## 2013-10-12 DIAGNOSIS — G8929 Other chronic pain: Secondary | ICD-10-CM | POA: Insufficient documentation

## 2013-10-12 DIAGNOSIS — R109 Unspecified abdominal pain: Secondary | ICD-10-CM | POA: Insufficient documentation

## 2013-10-12 DIAGNOSIS — L0291 Cutaneous abscess, unspecified: Secondary | ICD-10-CM | POA: Insufficient documentation

## 2013-10-12 DIAGNOSIS — R6883 Chills (without fever): Secondary | ICD-10-CM | POA: Insufficient documentation

## 2013-10-12 DIAGNOSIS — I10 Essential (primary) hypertension: Secondary | ICD-10-CM | POA: Insufficient documentation

## 2013-10-12 DIAGNOSIS — R509 Fever, unspecified: Secondary | ICD-10-CM | POA: Insufficient documentation

## 2013-10-12 LAB — CBC WITH DIFFERENTIAL/PLATELET
BASOS ABS: 0 10*3/uL (ref 0.0–0.1)
Basophils Relative: 0 % (ref 0–1)
Eosinophils Absolute: 0.1 10*3/uL (ref 0.0–0.7)
Eosinophils Relative: 2 % (ref 0–5)
HEMATOCRIT: 44.8 % (ref 36.0–46.0)
Hemoglobin: 15 g/dL (ref 12.0–15.0)
LYMPHS PCT: 32 % (ref 12–46)
Lymphs Abs: 1.6 10*3/uL (ref 0.7–4.0)
MCH: 34.4 pg — ABNORMAL HIGH (ref 26.0–34.0)
MCHC: 33.5 g/dL (ref 30.0–36.0)
MCV: 102.8 fL — ABNORMAL HIGH (ref 78.0–100.0)
MONO ABS: 0.3 10*3/uL (ref 0.1–1.0)
Monocytes Relative: 7 % (ref 3–12)
NEUTROS ABS: 2.9 10*3/uL (ref 1.7–7.7)
Neutrophils Relative %: 59 % (ref 43–77)
PLATELETS: 190 10*3/uL (ref 150–400)
RBC: 4.36 MIL/uL (ref 3.87–5.11)
RDW: 12.3 % (ref 11.5–15.5)
WBC: 5 10*3/uL (ref 4.0–10.5)

## 2013-10-12 LAB — URINALYSIS, ROUTINE W REFLEX MICROSCOPIC
Bilirubin Urine: NEGATIVE
Glucose, UA: NEGATIVE mg/dL
Hgb urine dipstick: NEGATIVE
Ketones, ur: NEGATIVE mg/dL
LEUKOCYTES UA: NEGATIVE
Nitrite: NEGATIVE
PH: 6 (ref 5.0–8.0)
PROTEIN: NEGATIVE mg/dL
Urobilinogen, UA: 0.2 mg/dL (ref 0.0–1.0)

## 2013-10-12 LAB — BASIC METABOLIC PANEL
BUN: 5 mg/dL — ABNORMAL LOW (ref 6–23)
CALCIUM: 9.7 mg/dL (ref 8.4–10.5)
CHLORIDE: 98 meq/L (ref 96–112)
CO2: 30 meq/L (ref 19–32)
Creatinine, Ser: 0.62 mg/dL (ref 0.50–1.10)
GFR calc Af Amer: >90 mL/min (ref >90)
GFR calc non Af Amer: >90 mL/min (ref >90)
GLUCOSE: 115 mg/dL — AB (ref 70–99)
Potassium: 4.4 mEq/L (ref 3.7–5.3)
SODIUM: 140 meq/L (ref 137–147)

## 2013-10-12 MED ORDER — ONDANSETRON 4 MG PO TBDP: 4.0000 mg | ORAL_TABLET | Freq: Three times a day (TID) | ORAL | Status: DC | PRN

## 2013-10-12 MED ORDER — SODIUM CHLORIDE 0.9 % IV BOLUS (SEPSIS)
1000.0000 mL | Freq: Once | INTRAVENOUS | Status: AC
  Administered 2013-10-12: 1000 mL via INTRAVENOUS

## 2013-10-12 MED ORDER — PROMETHAZINE HCL 25 MG/ML IJ SOLN
25.0000 mg | Freq: Once | INTRAMUSCULAR | Status: AC
  Administered 2013-10-12: 25 mg via INTRAVENOUS
  Filled 2013-10-12: qty 1

## 2013-10-12 MED ORDER — IOHEXOL 300 MG/ML  SOLN
100.0000 mL | Freq: Once | INTRAMUSCULAR | Status: AC | PRN
  Administered 2013-10-12: 100 mL via INTRAVENOUS

## 2013-10-12 MED ORDER — PROMETHAZINE HCL 25 MG PO TABS: 25.0000 mg | ORAL_TABLET | Freq: Four times a day (QID) | ORAL | Status: DC | PRN

## 2013-10-12 MED ORDER — GLYCOPYRROLATE 0.2 MG/ML IJ SOLN
0.1000 mg | Freq: Once | INTRAMUSCULAR | Status: AC
  Administered 2013-10-12: 0.1 mg via INTRAVENOUS
  Filled 2013-10-12: qty 1

## 2013-10-12 MED ORDER — ONDANSETRON 8 MG PO TBDP
8.0000 mg | ORAL_TABLET | Freq: Once | ORAL | Status: AC
  Administered 2013-10-12: 8 mg via ORAL
  Filled 2013-10-12: qty 1

## 2013-10-12 MED ORDER — MORPHINE SULFATE 4 MG/ML IJ SOLN
4.0000 mg | INTRAMUSCULAR | Status: DC | PRN
  Administered 2013-10-12 (×2): 4 mg via INTRAVENOUS
  Filled 2013-10-12 (×2): qty 1

## 2013-10-12 MED ORDER — IOHEXOL 300 MG/ML  SOLN
50.0000 mL | Freq: Once | INTRAMUSCULAR | Status: AC | PRN
  Administered 2013-10-12: 50 mL via ORAL

## 2013-10-12 MED ORDER — HYDROCODONE-ACETAMINOPHEN 5-325 MG PO TABS: 2.0000 | ORAL_TABLET | ORAL | Status: DC | PRN

## 2013-10-12 MED ORDER — ONDANSETRON HCL 4 MG/2ML IJ SOLN
4.0000 mg | Freq: Once | INTRAMUSCULAR | Status: AC
  Administered 2013-10-12: 4 mg via INTRAVENOUS
  Filled 2013-10-12: qty 2

## 2013-10-12 NOTE — ED Notes (Signed)
Notified by ancillary staff that pt was bleeding from iv site. Upon entering room, found pt going through drawers and pt had removed own iv and applied bandaid. States she had to leave beacuse her ride was coming.

## 2013-10-12 NOTE — Discharge Instructions (Signed)
Abdominal Pain, Adult  Many things can cause belly (abdominal) pain. Most times, the belly pain is not dangerous. Many cases of belly pain can be watched and treated at home.  HOME CARE   · Do not take medicines that help you go poop (laxatives) unless told to by your doctor.  · Only take medicine as told by your doctor.  · Eat or drink as told by your doctor. Your doctor will tell you if you should be on a special diet.  GET HELP IF:  · You do not know what is causing your belly pain.  · You have belly pain while you are sick to your stomach (nauseous) or have runny poop (diarrhea).  · You have pain while you pee or poop.  · Your belly pain wakes you up at night.  · You have belly pain that gets worse or better when you eat.  · You have belly pain that gets worse when you eat fatty foods.  GET HELP RIGHT AWAY IF:   · The pain does not go away within 2 hours.  · You have a fever.  · You keep throwing up (vomiting).  · The pain changes and is only in the right or left part of the belly.  · You have bloody or tarry looking poop.  MAKE SURE YOU:   · Understand these instructions.  · Will watch your condition.  · Will get help right away if you are not doing well or get worse.  Document Released: 12/05/2007 Document Revised: 04/08/2013 Document Reviewed: 02/25/2013  [employer]® Patient Information ©2014 [employer].

## 2013-10-12 NOTE — ED Notes (Signed)
Pt received discharge instructions and prescriptions, verbalized understanding and has no further questions. Pt ambulated to exit in stable condition.  Advised to return to emergency department with new or worsening symptoms.  

## 2013-10-12 NOTE — ED Notes (Signed)
Vomiting and abdominal pain that started today.  Also c/o 2 bug bites.

## 2013-10-12 NOTE — ED Provider Notes (Signed)
CSN: [id number]     Arrival date & time 10/12/13  1442 History  This chart was scribed for Erika Rodriguez Doctor Sheahan, MD by Dorothey Basemania Sutton, ED Scribe. This patient was seen in room [facility]10/[facility]10 and the patient's care was started at 4:12 PM.    Chief Complaint  Patient presents with  . Abdominal Pain  . Emesis   The history is provided by the patient. No language interpreter was used.   HPI Comments: Erika Rodriguez C Para MarchDuncan is a 48 y.o. Female with a history of abdominal hysterectomy, cholecystectomy, and esophagogastroduodenoscopy who presents to the Emergency Department complaining of an intermittent, cramping pain to the suprapubic region of the abdomen that she states radiates to the left side of the mid-back with associated multiple episodes of non-bilious, non-bloody emesis, chills, and subjective fever (patient is afebrile at 98.1 in the ED) onset earlier today. She reports some associated nausea and decreased appetite onset last night. She denies diarrhea, chest pain, shortness of breath, blood in the stool, urinary symptoms. Patient reports that she does not get a menstrual period secondary to her history of hysterectomy. Patient also has a history of HTN and chronic back pain.   Patient is also complaining of two erythematous bumps to the left axilla and left, lateral thigh onset a few days ago that she states may be insect bites, although she denies seeing any insects bite her.   Past Medical History  Diagnosis Date  . Chronic back pain   . Hypertension    Past Surgical History  Procedure Laterality Date  . Abdominal hysterectomy    . Cholecystectomy    . Esophagogastroduodenoscopy N/A 11/05/2012    NFA:OZHYQMRMR:Hiatal hernia. Subtle mucosal abdomen obese of the esophagus, stomach and duodenum-of uncertain significance-s/p bx   Family History  Problem Relation Age of Onset  . Ulcers Neg Hx   . Liver disease Neg Hx   . Colon cancer Neg Hx   . Crohn's disease Neg Hx   . Ulcerative colitis Neg Hx   . Diabetes Mother    . Heart failure Mother   . Stroke Sister   . Epilepsy Sister    History  Substance Use Topics  . Smoking status: Current Every Day Smoker -- 0.50 packs/day for 20 years    Types: Cigarettes  . Smokeless tobacco: Never Used  . Alcohol Use: No   OB History   Grav Para Term Preterm Abortions TAB SAB Ect Mult Living   1    1  1    0     Review of Systems  Constitutional: Positive for fever (subjective, resolved), chills and appetite change. Negative for diaphoresis and fatigue.  HENT: Negative for mouth sores, sore throat and trouble swallowing.   Eyes: Negative for visual disturbance.  Respiratory: Negative for cough, chest tightness, shortness of breath and wheezing.   Cardiovascular: Negative for chest pain.  Gastrointestinal: Positive for nausea, vomiting and abdominal pain. Negative for diarrhea, blood in stool and abdominal distention.  Endocrine: Negative for polydipsia, polyphagia and polyuria.  Genitourinary: Negative for dysuria, frequency and hematuria.  Musculoskeletal: Negative for gait problem.  Skin: Positive for wound (abscess). Negative for color change, pallor and rash.  Neurological: Negative for dizziness, syncope, light-headedness and headaches.  Hematological: Does not bruise/bleed easily.  Psychiatric/Behavioral: Negative for behavioral problems and confusion.      Allergies  Review of patient's allergies indicates no known allergies.  Home Medications   Current Outpatient Rx  Name  Route  Sig  Dispense  Refill  .  naproxen sodium (ANAPROX) 220 MG tablet   Oral   Take 440 mg by mouth 2 (two) times daily as needed (for pain).          . traMADol (ULTRAM) 50 MG tablet   Oral   Take 50-100 mg by mouth once as needed for moderate pain or severe pain.         Marland Kitchen. HYDROcodone-acetaminophen (NORCO/VICODIN) 5-325 MG per tablet   Oral   Take 2 tablets by mouth every 4 (four) hours as needed.   8 tablet   0   . ondansetron (ZOFRAN ODT) 4 MG  disintegrating tablet   Oral   Take 1 tablet (4 mg total) by mouth every 8 (eight) hours as needed for nausea.   20 tablet   0    Triage Vitals: BP 176/97  Pulse 98  Temp(Src) 98.1 F (36.7 C) (Oral)  Resp 18  Ht 5\' 3"  (1.6 m)  Wt 183 lb (83.008 kg)  BMI 32.43 kg/m2  SpO2 96%  Physical Exam  Constitutional: She is oriented to person, place, and time. She appears well-developed and well-nourished. No distress.  HENT:  Head: Normocephalic.  Eyes: Conjunctivae are normal. Pupils are equal, round, and reactive to light. No scleral icterus.  Neck: Normal range of motion. Neck supple. No thyromegaly present.  Cardiovascular: Normal rate and regular rhythm.  Exam reveals no gallop and no friction rub.   No murmur heard. Pulmonary/Chest: Effort normal and breath sounds normal. No respiratory distress. She has no wheezes. She has no rales.  Abdominal: Soft. Bowel sounds are normal. She exhibits no distension. There is tenderness. There is no rebound.  Tenderness to palpation to bilateral lower quadrants. No peritoneal signs.   Musculoskeletal: Normal range of motion.  Neurological: She is alert and oriented to person, place, and time.  Skin: Skin is warm and dry. No rash noted. There is erythema.  Erythema and excoriation around the left axilla. Erythema and induration to the left, lateral thigh.   Psychiatric: She has a normal mood and affect. Her behavior is normal.    ED Course  Procedures (including critical care time)  DIAGNOSTIC STUDIES: Oxygen Saturation is 96% on room air, normal by my interpretation.    COORDINATION OF CARE: 3:45 PM- Ordered CBC, BMP, and UA. Ordered Zofran to manage symptoms.  4:19 PM- Discussed treatment plan with patient at bedside and patient verbalized agreement.     Labs Review Labs Reviewed  CBC WITH DIFFERENTIAL - Abnormal; Notable for the following:    MCV 102.8 (*)    MCH 34.4 (*)    All other components within normal limits  BASIC  METABOLIC PANEL - Abnormal; Notable for the following:    Glucose, Bld 115 (*)    BUN 5 (*)    All other components within normal limits  URINALYSIS, ROUTINE W REFLEX MICROSCOPIC - Abnormal; Notable for the following:    Specific Gravity, Urine <1.005 (*)    All other components within normal limits   Imaging Review Ct Abdomen Pelvis W Contrast  10/12/2013   CLINICAL DATA:  Lower abdominal pain and cramping.  EXAM: CT ABDOMEN AND PELVIS WITH CONTRAST  TECHNIQUE: Multidetector CT imaging of the abdomen and pelvis was performed using the standard protocol following bolus administration of intravenous contrast.  CONTRAST:  50mL OMNIPAQUE IOHEXOL 300 MG/ML SOLN, 100mL OMNIPAQUE IOHEXOL 300 MG/ML SOLN  COMPARISON:  10/07/2012 report  FINDINGS: Lung bases are clear. No pleural or pericardial fluid. Premature coronary artery calcification  is noted.  There is fatty change of the liver. There has been previous cholecystectomy. The spleen is normal. Pancreas is normal. The adrenal glands are normal. The kidneys are normal. No cyst, mass, stone or hydronephrosis. The aorta shows atherosclerosis but no aneurysm. The IVC is normal. The appendix is normal. No other bowel pathology. There has been previous hysterectomy. Bladder appears unremarkable. Chronic degenerative changes affect the spine.  IMPRESSION: No cause of acute symptoms is identified.  Premature coronary artery calcification.  Fatty change of the liver.   Electronically Signed   By: Paulina Fusi M.D.   On: 10/12/2013 20:11     EKG Interpretation None      MDM   Final diagnoses:  Vomiting  Abdominal pain    CT shows no acute findings. Labs were concerning. Symptoms are somewhat improved but not resolved. Abdomen remains benign without peritoneal irritation. Anxious appropriate for outpatient treatment. Clear liquids. Antiemetics. Primary care followup if not improving.  I personally performed the services described in this documentation,  which was scribed in my presence. The recorded information has been reviewed and is accurate.     Erika Porter, MD 10/12/13 2122

## 2013-10-12 NOTE — ED Notes (Signed)
Pt states she started having nausea over weekend and now present with vomiting and abdominal pain that describes as "like leg cramps in my belly".

## 2013-10-14 ENCOUNTER — Encounter (HOSPITAL_COMMUNITY): Payer: Self-pay | Admitting: Emergency Medicine

## 2013-10-14 ENCOUNTER — Inpatient Hospital Stay (HOSPITAL_COMMUNITY)
Admission: EM | Admit: 2013-10-14 | Discharge: 2013-10-18 | DRG: 918 | Disposition: A | Discharge status: Home / Self Care | Payer: Self-pay | Attending: Internal Medicine | Admitting: Internal Medicine

## 2013-10-14 DIAGNOSIS — F17200 Nicotine dependence, unspecified, uncomplicated: Secondary | ICD-10-CM | POA: Diagnosis present

## 2013-10-14 DIAGNOSIS — R112 Nausea with vomiting, unspecified: Secondary | ICD-10-CM | POA: Diagnosis present

## 2013-10-14 DIAGNOSIS — D696 Thrombocytopenia, unspecified: Secondary | ICD-10-CM | POA: Diagnosis present

## 2013-10-14 DIAGNOSIS — Z823 Family history of stroke: Secondary | ICD-10-CM

## 2013-10-14 DIAGNOSIS — E876 Hypokalemia: Secondary | ICD-10-CM | POA: Diagnosis present

## 2013-10-14 DIAGNOSIS — R7401 Elevation of levels of liver transaminase levels: Secondary | ICD-10-CM | POA: Diagnosis present

## 2013-10-14 DIAGNOSIS — K5900 Constipation, unspecified: Secondary | ICD-10-CM | POA: Diagnosis present

## 2013-10-14 DIAGNOSIS — R7989 Other specified abnormal findings of blood chemistry: Secondary | ICD-10-CM | POA: Diagnosis present

## 2013-10-14 DIAGNOSIS — Z833 Family history of diabetes mellitus: Secondary | ICD-10-CM

## 2013-10-14 DIAGNOSIS — M549 Dorsalgia, unspecified: Secondary | ICD-10-CM | POA: Diagnosis present

## 2013-10-14 DIAGNOSIS — R7402 Elevation of levels of lactic acid dehydrogenase (LDH): Secondary | ICD-10-CM | POA: Diagnosis present

## 2013-10-14 DIAGNOSIS — E860 Dehydration: Secondary | ICD-10-CM | POA: Diagnosis present

## 2013-10-14 DIAGNOSIS — R1084 Generalized abdominal pain: Secondary | ICD-10-CM | POA: Diagnosis present

## 2013-10-14 DIAGNOSIS — K716 Toxic liver disease with hepatitis, not elsewhere classified: Secondary | ICD-10-CM

## 2013-10-14 DIAGNOSIS — Z720 Tobacco use: Secondary | ICD-10-CM

## 2013-10-14 DIAGNOSIS — G8929 Other chronic pain: Secondary | ICD-10-CM | POA: Diagnosis present

## 2013-10-14 DIAGNOSIS — Z8249 Family history of ischemic heart disease and other diseases of the circulatory system: Secondary | ICD-10-CM

## 2013-10-14 DIAGNOSIS — Z820 Family history of epilepsy and other diseases of the nervous system: Secondary | ICD-10-CM

## 2013-10-14 DIAGNOSIS — T391X1A Poisoning by 4-Aminophenol derivatives, accidental (unintentional), initial encounter: Principal | ICD-10-CM | POA: Diagnosis present

## 2013-10-14 DIAGNOSIS — K759 Inflammatory liver disease, unspecified: Secondary | ICD-10-CM | POA: Diagnosis present

## 2013-10-14 DIAGNOSIS — I10 Essential (primary) hypertension: Secondary | ICD-10-CM | POA: Diagnosis present

## 2013-10-14 DIAGNOSIS — K625 Hemorrhage of anus and rectum: Secondary | ICD-10-CM

## 2013-10-14 DIAGNOSIS — K921 Melena: Secondary | ICD-10-CM

## 2013-10-14 DIAGNOSIS — G4700 Insomnia, unspecified: Secondary | ICD-10-CM | POA: Diagnosis present

## 2013-10-14 DIAGNOSIS — R740 Nonspecific elevation of levels of transaminase and lactic acid dehydrogenase [LDH]: Secondary | ICD-10-CM

## 2013-10-14 LAB — CBC WITH DIFFERENTIAL/PLATELET
BASOS PCT: 0 % (ref 0–1)
Basophils Absolute: 0 10*3/uL (ref 0.0–0.1)
EOS ABS: 0 10*3/uL (ref 0.0–0.7)
Eosinophils Relative: 0 % (ref 0–5)
HCT: 47.7 % — ABNORMAL HIGH (ref 36.0–46.0)
Hemoglobin: 16.4 g/dL — ABNORMAL HIGH (ref 12.0–15.0)
Lymphocytes Relative: 8 % — ABNORMAL LOW (ref 12–46)
Lymphs Abs: 0.8 10*3/uL (ref 0.7–4.0)
MCH: 34.9 pg — AB (ref 26.0–34.0)
MCHC: 34.4 g/dL (ref 30.0–36.0)
MCV: 101.5 fL — ABNORMAL HIGH (ref 78.0–100.0)
Monocytes Absolute: 0.3 10*3/uL (ref 0.1–1.0)
Monocytes Relative: 4 % (ref 3–12)
NEUTROS PCT: 88 % — AB (ref 43–77)
Neutro Abs: 7.8 10*3/uL — ABNORMAL HIGH (ref 1.7–7.7)
PLATELETS: 160 10*3/uL (ref 150–400)
RBC: 4.7 MIL/uL (ref 3.87–5.11)
RDW: 12.3 % (ref 11.5–15.5)
WBC: 8.9 10*3/uL (ref 4.0–10.5)

## 2013-10-14 LAB — URINE MICROSCOPIC-ADD ON

## 2013-10-14 LAB — COMPREHENSIVE METABOLIC PANEL
ALBUMIN: 4.1 g/dL (ref 3.5–5.2)
ALK PHOS: 74 U/L (ref 39–117)
ALT: 1178 U/L — AB (ref 0–35)
AST: 2464 U/L — ABNORMAL HIGH (ref 0–37)
BUN: 7 mg/dL (ref 6–23)
CO2: 24 mEq/L (ref 19–32)
Calcium: 9.2 mg/dL (ref 8.4–10.5)
Chloride: 100 mEq/L (ref 96–112)
Creatinine, Ser: 0.58 mg/dL (ref 0.50–1.10)
GFR calc Af Amer: >90 mL/min (ref >90)
GFR calc non Af Amer: >90 mL/min (ref >90)
Glucose, Bld: 120 mg/dL — ABNORMAL HIGH (ref 70–99)
Potassium: 4.2 mEq/L (ref 3.7–5.3)
SODIUM: 140 meq/L (ref 137–147)
Total Bilirubin: 1.4 mg/dL — ABNORMAL HIGH (ref 0.3–1.2)
Total Protein: 7.7 g/dL (ref 6.0–8.3)

## 2013-10-14 LAB — RAPID URINE DRUG SCREEN, HOSP PERFORMED
Amphetamines: NOT DETECTED
BARBITURATES: NOT DETECTED
BENZODIAZEPINES: POSITIVE — AB
Cocaine: NOT DETECTED
Opiates: POSITIVE — AB
Tetrahydrocannabinol: NOT DETECTED

## 2013-10-14 LAB — PROTIME-INR
INR: 1.41 (ref 0.00–1.49)
PROTHROMBIN TIME: 16.9 s — AB (ref 11.6–15.2)

## 2013-10-14 LAB — URINALYSIS, ROUTINE W REFLEX MICROSCOPIC
BILIRUBIN URINE: NEGATIVE
Glucose, UA: NEGATIVE mg/dL
Hgb urine dipstick: NEGATIVE
Leukocytes, UA: NEGATIVE
Nitrite: NEGATIVE
Protein, ur: 30 mg/dL — AB
Specific Gravity, Urine: >1.03 — ABNORMAL HIGH (ref 1.005–1.030)
Urobilinogen, UA: 0.2 mg/dL (ref 0.0–1.0)
pH: 5 (ref 5.0–8.0)

## 2013-10-14 LAB — LIPASE, BLOOD: LIPASE: 17 U/L (ref 11–59)

## 2013-10-14 LAB — ACETAMINOPHEN LEVEL

## 2013-10-14 LAB — APTT: aPTT: 29 seconds (ref 24–37)

## 2013-10-14 MED ORDER — LORAZEPAM 2 MG/ML IJ SOLN
1.0000 mg | Freq: Once | INTRAMUSCULAR | Status: AC
  Administered 2013-10-14: 1 mg via INTRAVENOUS
  Filled 2013-10-14: qty 1

## 2013-10-14 MED ORDER — ONDANSETRON 8 MG PO TBDP
8.0000 mg | ORAL_TABLET | Freq: Once | ORAL | Status: AC
  Administered 2013-10-14: 8 mg via ORAL

## 2013-10-14 MED ORDER — PROMETHAZINE HCL 25 MG/ML IJ SOLN
25.0000 mg | Freq: Once | INTRAMUSCULAR | Status: AC
  Administered 2013-10-14: 25 mg via INTRAMUSCULAR
  Filled 2013-10-14: qty 1

## 2013-10-14 MED ORDER — HYDROMORPHONE HCL PF 1 MG/ML IJ SOLN
1.0000 mg | INTRAMUSCULAR | Status: DC | PRN
  Administered 2013-10-14 – 2013-10-18 (×21): 1 mg via INTRAVENOUS
  Filled 2013-10-14 (×21): qty 1

## 2013-10-14 MED ORDER — SODIUM CHLORIDE 0.9 % IV SOLN
1000.0000 mL | Freq: Once | INTRAVENOUS | Status: AC
  Administered 2013-10-14: 1000 mL via INTRAVENOUS

## 2013-10-14 MED ORDER — SODIUM CHLORIDE 0.9 % IV SOLN
1000.0000 mL | INTRAVENOUS | Status: DC
  Administered 2013-10-14: 1000 mL via INTRAVENOUS

## 2013-10-14 MED ORDER — FENTANYL CITRATE 0.05 MG/ML IJ SOLN
50.0000 ug | Freq: Once | INTRAMUSCULAR | Status: AC
Start: 2013-10-14 — End: 2013-10-14
  Administered 2013-10-14: 50 ug via INTRAVENOUS
  Filled 2013-10-14: qty 2

## 2013-10-14 MED ORDER — ONDANSETRON HCL 4 MG/2ML IJ SOLN
4.0000 mg | Freq: Once | INTRAMUSCULAR | Status: AC
  Administered 2013-10-14: 4 mg via INTRAVENOUS
  Filled 2013-10-14: qty 2

## 2013-10-14 MED ORDER — ONDANSETRON 8 MG PO TBDP
ORAL_TABLET | ORAL | Status: AC
  Filled 2013-10-14: qty 1

## 2013-10-14 MED ORDER — METOCLOPRAMIDE HCL 5 MG/ML IJ SOLN
10.0000 mg | Freq: Once | INTRAMUSCULAR | Status: AC
  Administered 2013-10-14: 10 mg via INTRAVENOUS
  Filled 2013-10-14: qty 2

## 2013-10-14 MED ORDER — ONDANSETRON HCL 4 MG/2ML IJ SOLN
4.0000 mg | Freq: Once | INTRAMUSCULAR | Status: AC
  Administered 2013-10-14: 4 mg via INTRAMUSCULAR
  Filled 2013-10-14: qty 2

## 2013-10-14 MED ORDER — SODIUM CHLORIDE 0.9 % IV BOLUS (SEPSIS)
1000.0000 mL | Freq: Once | INTRAVENOUS | Status: AC
  Administered 2013-10-14: 1000 mL via INTRAVENOUS

## 2013-10-14 MED ORDER — FENTANYL CITRATE 0.05 MG/ML IJ SOLN
50.0000 ug | Freq: Once | INTRAMUSCULAR | Status: AC
  Administered 2013-10-14: 50 ug via INTRAVENOUS
  Filled 2013-10-14: qty 2

## 2013-10-14 MED ORDER — PROMETHAZINE HCL 25 MG/ML IJ SOLN
25.0000 mg | Freq: Once | INTRAMUSCULAR | Status: AC
  Administered 2013-10-14: 25 mg via INTRAVENOUS
  Filled 2013-10-14: qty 1

## 2013-10-14 MED ORDER — DIPHENHYDRAMINE HCL 50 MG/ML IJ SOLN
25.0000 mg | Freq: Once | INTRAMUSCULAR | Status: AC
  Administered 2013-10-14: 25 mg via INTRAVENOUS
  Filled 2013-10-14: qty 1

## 2013-10-14 NOTE — ED Notes (Signed)
Pt co abdominal pain and emesis since Monday, seen here for same, no relief.

## 2013-10-14 NOTE — ED Notes (Signed)
Patient complaining of severe abdominal pain. Rates pain at a 10.

## 2013-10-14 NOTE — ED Notes (Signed)
Patient vomiting at this time. Paged Dr Onalee Huaavid.

## 2013-10-14 NOTE — H&P (Signed)
Chief Complaint:  N/v abd pain  HPI: 48 yo female h/o chronic generalized abd pain for past year with neg w/u (sees dr Gala Romney) comes in with worse abd pain with associated n/v for past 2 days.  Pain in crampy and general.  No diarrhea.  She says she has been having subjective fevers and chills for at least 2 months at night.  Vomit is nonbloody.  N/v is new symptom for past 2 days.  No yellowish discoloration of skin.  She has been having general malaise for at least 2 weeks.  She has been taking much more tylenol than normal sometimes up to over 5 gms of tyelonal a day for chronic pain issues.  She also takes 3 tylenol pm at night for her insomnia.  Denies etoh or any other drug abuse.  She is requesting sleeping pills and narcotics to be prescribed when she goes home.  Review of Systems:  Positive and negative as per HPI otherwise all other systems are negative  Past Medical History: Past Medical History  Diagnosis Date  . Chronic back pain   . Hypertension    Past Surgical History  Procedure Laterality Date  . Abdominal hysterectomy    . Cholecystectomy    . Esophagogastroduodenoscopy N/A 11/05/2012    OYD:XAJOIN hernia. Subtle mucosal abdomen obese of the esophagus, stomach and duodenum-of uncertain significance-s/p bx    Medications: Prior to Admission medications   Medication Sig Start Date End Date Taking? Authorizing Provider  naproxen sodium (ANAPROX) 220 MG tablet Take 440 mg by mouth 2 (two) times daily as needed (for pain).    Yes Historical Provider, MD  ondansetron (ZOFRAN ODT) 4 MG disintegrating tablet Take 1 tablet (4 mg total) by mouth every 8 (eight) hours as needed for nausea. 10/12/13  Yes Tanna Furry, MD    Allergies:  No Known Allergies  Social History:  reports that she has been smoking Cigarettes.  She has a 10 pack-year smoking history. She has never used smokeless tobacco. She reports that she does not drink alcohol or use illicit drugs.  Family  History: Family History  Problem Relation Age of Onset  . Ulcers Neg Hx   . Liver disease Neg Hx   . Colon cancer Neg Hx   . Crohn's disease Neg Hx   . Ulcerative colitis Neg Hx   . Diabetes Mother   . Heart failure Mother   . Stroke Sister   . Epilepsy Sister     Physical Exam: Filed Vitals:   10/14/13 1304 10/14/13 1706 10/14/13 2018  BP: 169/95 156/97 178/89  Pulse: 68 66 78  Temp: 97.6 F (36.4 C)    TempSrc: Oral    Resp: '18 20 18  ' Height: '5\' 2"'  (1.575 m)    Weight: 81.647 kg (180 lb)    SpO2: 100% 99% 96%   General appearance: alert, cooperative and no distress Head: Normocephalic, without obvious abnormality, atraumatic Eyes: negative  nonicteric sclera Nose: Nares normal. Septum midline. Mucosa normal. No drainage or sinus tenderness. Neck: no JVD and supple, symmetrical, trachea midline Lungs: clear to auscultation bilaterally Heart: regular rate and rhythm, S1, S2 normal, no murmur, click, rub or gallop Abdomen: soft, non-tender; bowel sounds normal; no masses,  no organomegaly  Mild diffuse ttp nonacute abd Extremities: extremities normal, atraumatic, no cyanosis or edema Pulses: 2+ and symmetric Skin: Skin color, texture, turgor normal. No rashes or lesions Neurologic: Grossly normal  Labs on Admission:   Recent Labs  10/12/13 1521 10/14/13  1450  NA 140 140  K 4.4 4.2  CL 98 100  CO2 30 24  GLUCOSE 115* 120*  BUN 5* 7  CREATININE 0.62 0.58  CALCIUM 9.7 9.2    Recent Labs  10/14/13 1450  AST 2464*  ALT 1178*  ALKPHOS 74  BILITOT 1.4*  PROT 7.7  ALBUMIN 4.1    Recent Labs  10/14/13 1450  LIPASE 17    Recent Labs  10/12/13 1521 10/14/13 1450  WBC 5.0 8.9  NEUTROABS 2.9 7.8*  HGB 15.0 16.4*  HCT 44.8 47.7*  MCV 102.8* 101.5*  PLT 190 160   Radiological Exams on Admission: Ct Abdomen Pelvis W Contrast  10/12/2013   CLINICAL DATA:  Lower abdominal pain and cramping.  EXAM: CT ABDOMEN AND PELVIS WITH CONTRAST  TECHNIQUE:  Multidetector CT imaging of the abdomen and pelvis was performed using the standard protocol following bolus administration of intravenous contrast.  CONTRAST:  54m OMNIPAQUE IOHEXOL 300 MG/ML SOLN, 108mOMNIPAQUE IOHEXOL 300 MG/ML SOLN  COMPARISON:  10/07/2012 report  FINDINGS: Lung bases are clear. No pleural or pericardial fluid. Premature coronary artery calcification is noted.  There is fatty change of the liver. There has been previous cholecystectomy. The spleen is normal. Pancreas is normal. The adrenal glands are normal. The kidneys are normal. No cyst, mass, stone or hydronephrosis. The aorta shows atherosclerosis but no aneurysm. The IVC is normal. The appendix is normal. No other bowel pathology. There has been previous hysterectomy. Bladder appears unremarkable. Chronic degenerative changes affect the spine.  IMPRESSION: No cause of acute symptoms is identified.  Premature coronary artery calcification.  Fatty change of the liver.   Electronically Signed   By: MaNelson Chimes.D.   On: 10/12/2013 20:11    Assessment/Plan  [age]o female with acute on chronic generalized abd pain with n/v and markedly elevated lfts  Principal Problem:   Hepatitis- unclear etiology.  Acute hep panel pending.  Alk phos and lipase are nml.  Could be from chronic APAP overuse???  ivf overnight.  apap level low now.  Repeat liver tests in am.  Consult GI.  S/p chole.  Ck ultrasound.    Active Problems:   Nausea with vomiting  zofran   Abdominal pain, chronic, generalized   Abdominal pain, acute, generalized   Chronic back pain   Hypertension  Full code.    Bane Hagy A DaShanon Brow/15/2015, 8:32 PM

## 2013-10-14 NOTE — ED Notes (Signed)
Patient vomiting in room. Advised MD.

## 2013-10-14 NOTE — ED Provider Notes (Signed)
CSN: [id number]     Arrival date & time 10/14/13  1237 History  This chart was scribed for Ward GivensIva L Tarshia Kot, MD by Leone PayorSonum Patel, ED Scribe. This patient was seen in room APA03/APA03 and the patient's care was started 2:22 PM.    Chief Complaint  Patient presents with  . Abdominal Pain      The history is provided by the patient. No language interpreter was used.    HPI Comments: Erika Rodriguez is a 48 y.o. female who presents to the Emergency Department complaining of 1-2 days of constant, unchanged generalized abdominal pain with associated nausea and vomiting. She was seen on 10/12/13 for similar symptoms and was discharged home with antiemetics. She states she was improving when she was discharged but her symptoms have returned yesterday. She is also having dizziness when standing. She states these symptoms have been ongoing for the past 1 year and has had negative upper endoscopy and swallowing studdy. She denies sick contacts or suspicious food intake. She denies diarrhea, fever, hematemesis. She is a smoker and twice weekly alcohol user. Her last alcohol use was Friday, drinks 1-6 beers.   [facility]  No PCP  Past Medical History  Diagnosis Date  . Chronic back pain   . Hypertension    Past Surgical History  Procedure Laterality Date  . Abdominal hysterectomy    . Cholecystectomy    . Esophagogastroduodenoscopy N/A 11/05/2012    XBJ:YNWGNFRMR:Hiatal hernia. Subtle mucosal abdomen obese of the esophagus, stomach and duodenum-of uncertain significance-s/p bx   Family History  Problem Relation Age of Onset  . Ulcers Neg Hx   . Liver disease Neg Hx   . Colon cancer Neg Hx   . Crohn's disease Neg Hx   . Ulcerative colitis Neg Hx   . Diabetes Mother   . Heart failure Mother   . Stroke Sister   . Epilepsy Sister    History  Substance Use Topics  . Smoking status: Current Every Day Smoker -- 0.50 packs/day for 20 years    Types: Cigarettes  . Smokeless tobacco: Never Used  . Alcohol Use: No    Employed Drinks one to 3 beers twice a week, the last time was 5 days ago   OB History   Grav Para Term Preterm Abortions TAB SAB Ect Mult Living   1    1  1    0     Review of Systems  Constitutional: Negative for fever and chills.  Gastrointestinal: Positive for nausea, vomiting and abdominal pain. Negative for diarrhea.  Neurological: Positive for dizziness.  All other systems reviewed and are negative.     Allergies  Review of patient's allergies indicates no known allergies.  Home Medications   Prior to Admission medications   Medication Sig Start Date End Date Taking? Authorizing Provider  HYDROcodone-acetaminophen (NORCO/VICODIN) 5-325 MG per tablet Take 2 tablets by mouth every 4 (four) hours as needed. 10/12/13   Rolland PorterMark James, MD  naproxen sodium (ANAPROX) 220 MG tablet Take 440 mg by mouth 2 (two) times daily as needed (for pain).     Historical Provider, MD  ondansetron (ZOFRAN ODT) 4 MG disintegrating tablet Take 1 tablet (4 mg total) by mouth every 8 (eight) hours as needed for nausea. 10/12/13   Rolland PorterMark James, MD  promethazine (PHENERGAN) 25 MG tablet Take 1 tablet (25 mg total) by mouth every 6 (six) hours as needed for nausea or vomiting. 10/12/13   Rolland PorterMark James, MD  traMADol Janean Sark(ULTRAM) 50  MG tablet Take 50-100 mg by mouth once as needed for moderate pain or severe pain.    Historical Provider, MD   BP 169/95  Pulse 68  Temp(Src) 97.6 F (36.4 C) (Oral)  Resp 18  Ht 5\' 2"  (1.575 m)  Wt 180 lb (81.647 kg)  BMI 32.91 kg/m2  SpO2 100%  Vital signs normal   Physical Exam  Nursing note and vitals reviewed. Constitutional: She is oriented to person, place, and time. She appears well-developed and well-nourished.  Non-toxic appearance. She does not appear ill. No distress.  HENT:  Head: Normocephalic and atraumatic.  Right Ear: External ear normal.  Left Ear: External ear normal.  Nose: Nose normal. No mucosal edema or rhinorrhea.  Mouth/Throat: Oropharynx is clear  and moist and mucous membranes are normal. No dental abscesses or uvula swelling.  Face is flushed   Eyes: Conjunctivae and EOM are normal. Pupils are equal, round, and reactive to light.  Neck: Normal range of motion and full passive range of motion without pain. Neck supple.  Cardiovascular: Normal rate, regular rhythm and normal heart sounds.  Exam reveals no gallop and no friction rub.   No murmur heard. Pulmonary/Chest: Effort normal and breath sounds normal. No respiratory distress. She has no wheezes. She has no rhonchi. She has no rales. She exhibits no tenderness and no crepitus.  Abdominal: Soft. Normal appearance and bowel sounds are normal. She exhibits no distension. There is tenderness. There is no rebound and no guarding.  Actively vomiting and retching. Abdomen is tender diffusely.   Musculoskeletal: Normal range of motion. She exhibits no edema and no tenderness.  Moves all extremities well.   Neurological: She is alert and oriented to person, place, and time. She has normal strength. No cranial nerve deficit.  Skin: Skin is warm, dry and intact. No rash noted. No erythema. No pallor.  Psychiatric: She has a normal mood and affect. Her speech is normal and behavior is normal. Her mood appears not anxious.    ED Course  Procedures (including critical care time)  Medications  0.9 %  sodium chloride infusion (0 mLs Intravenous Stopped 10/14/13 1705)    Followed by  0.9 %  sodium chloride infusion (0 mLs Intravenous Stopped 10/14/13 1853)    Followed by  0.9 %  sodium chloride infusion (not administered)  ondansetron (ZOFRAN-ODT) disintegrating tablet 8 mg (8 mg Oral Given 10/14/13 1327)  promethazine (PHENERGAN) injection 25 mg (25 mg Intramuscular Given 10/14/13 1508)  metoCLOPramide (REGLAN) injection 10 mg (10 mg Intravenous Given 10/14/13 1502)  diphenhydrAMINE (BENADRYL) injection 25 mg (25 mg Intravenous Given 10/14/13 1510)  LORazepam (ATIVAN) injection 1 mg (1 mg  Intravenous Given 10/14/13 1509)  fentaNYL (SUBLIMAZE) injection 50 mcg (50 mcg Intravenous Given 10/14/13 1701)  sodium chloride 0.9 % bolus 1,000 mL (1,000 mLs Intravenous New Bag/Given 10/14/13 1853)  fentaNYL (SUBLIMAZE) injection 50 mcg (50 mcg Intravenous Given 10/14/13 1900)  promethazine (PHENERGAN) injection 25 mg (25 mg Intramuscular Given 10/14/13 1900)  ondansetron (ZOFRAN) injection 4 mg (4 mg Intramuscular Given 10/14/13 1900)  ondansetron (ZOFRAN) injection 4 mg (4 mg Intravenous Given 10/14/13 2031)     DIAGNOSTIC STUDIES: Oxygen Saturation is 100% on RA, normal by my interpretation.    COORDINATION OF CARE: 2:00 PM Discussed treatment plan with pt at bedside and pt agreed to plan.  Patient continues to complain of nausea and pain. She was given additional medications.  18:30 patient admits to taking a lot of Tylenol over the past  2 weeks. She reports she is taking 8-10 of the generic extra strength Tylenol a day +3-4 Tylenol PM tonight. She had a cholecystectomy at least 12 years ago. She denies any recent alcohol use. Her last drink was 5 days ago. We discussed the new change in her liver tests since her blood work 2 days ago. A Tylenol level and PT /PTT was added to her lab work for possible Tylenol overdose.  Patient's Tylenol level has resulted in his normal. Her PT/ PTT are also normal. At this point the etiology of her hepatitis is not clear. She continues to have nausea and vomiting. She will need to  20:29 Dr Onalee Hua, admit to med-surg, team 2    Labs Review Results for orders placed during the hospital encounter of 10/14/13  CBC WITH DIFFERENTIAL      Result Value Ref Range   WBC 8.9  4.0 - 10.5 K/uL   RBC 4.70  3.87 - 5.11 MIL/uL   Hemoglobin 16.4 (*) 12.0 - 15.0 g/dL   HCT 16.1 (*) 09.6 - 04.5 %   MCV 101.5 (*) 78.0 - 100.0 fL   MCH 34.9 (*) 26.0 - 34.0 pg   MCHC 34.4  30.0 - 36.0 g/dL   RDW 40.9  81.1 - 91.4 %   Platelets 160  150 - 400 K/uL   Neutrophils  Relative % 88 (*) 43 - 77 %   Neutro Abs 7.8 (*) 1.7 - 7.7 K/uL   Lymphocytes Relative 8 (*) 12 - 46 %   Lymphs Abs 0.8  0.7 - 4.0 K/uL   Monocytes Relative 4  3 - 12 %   Monocytes Absolute 0.3  0.1 - 1.0 K/uL   Eosinophils Relative 0  0 - 5 %   Eosinophils Absolute 0.0  0.0 - 0.7 K/uL   Basophils Relative 0  0 - 1 %   Basophils Absolute 0.0  0.0 - 0.1 K/uL  COMPREHENSIVE METABOLIC PANEL      Result Value Ref Range   Sodium 140  137 - 147 mEq/L   Potassium 4.2  3.7 - 5.3 mEq/L   Chloride 100  96 - 112 mEq/L   CO2 24  19 - 32 mEq/L   Glucose, Bld 120 (*) 70 - 99 mg/dL   BUN 7  6 - 23 mg/dL   Creatinine, Ser 7.82  0.50 - 1.10 mg/dL   Calcium 9.2  8.4 - 95.6 mg/dL   Total Protein 7.7  6.0 - 8.3 g/dL   Albumin 4.1  3.5 - 5.2 g/dL   AST 2130 (*) 0 - 37 U/L   ALT 1178 (*) 0 - 35 U/L   Alkaline Phosphatase 74  39 - 117 U/L   Total Bilirubin 1.4 (*) 0.3 - 1.2 mg/dL   GFR calc non Af Amer >90  >90 mL/min   GFR calc Af Amer >90  >90 mL/min  LIPASE, BLOOD      Result Value Ref Range   Lipase 17  11 - 59 U/L  URINALYSIS, ROUTINE W REFLEX MICROSCOPIC      Result Value Ref Range   Color, Urine YELLOW  YELLOW   APPearance CLEAR  CLEAR   Specific Gravity, Urine >1.030 (*) 1.005 - 1.030   pH 5.0  5.0 - 8.0   Glucose, UA NEGATIVE  NEGATIVE mg/dL   Hgb urine dipstick NEGATIVE  NEGATIVE   Bilirubin Urine NEGATIVE  NEGATIVE   Ketones, ur TRACE (*) NEGATIVE mg/dL   Protein, ur 30 (*)  NEGATIVE mg/dL   Urobilinogen, UA 0.2  0.0 - 1.0 mg/dL   Nitrite NEGATIVE  NEGATIVE   Leukocytes, UA NEGATIVE  NEGATIVE  URINE RAPID DRUG SCREEN (HOSP PERFORMED)      Result Value Ref Range   Opiates POSITIVE (*) NONE DETECTED   Cocaine NONE DETECTED  NONE DETECTED   Benzodiazepines POSITIVE (*) NONE DETECTED   Amphetamines NONE DETECTED  NONE DETECTED   Tetrahydrocannabinol NONE DETECTED  NONE DETECTED   Barbiturates NONE DETECTED  NONE DETECTED  URINE MICROSCOPIC-ADD ON      Result Value Ref Range    Squamous Epithelial / LPF RARE  RARE   WBC, UA 0-2  <3 WBC/hpf   Bacteria, UA RARE  RARE   Casts GRANULAR CAST (*) NEGATIVE  ACETAMINOPHEN LEVEL      Result Value Ref Range   Acetaminophen (Tylenol), Serum <15.0  10 - 30 ug/mL  APTT      Result Value Ref Range   aPTT 29  24 - 37 seconds  PROTIME-INR      Result Value Ref Range   Prothrombin Time 16.9 (*) 11.6 - 15.2 seconds   INR 1.41  0.00 - 1.49   Laboratory interpretation all normal except +UDS however she did receive those medications in the ED, her urine is concentrated consistent with dehydration, her hemoglobin is elevated consistent with dehydration, her LFTs are significantly elevated, they were normal 2 days ago     Imaging Review Ct Abdomen Pelvis W Contrast  10/12/2013   CLINICAL DATA:  Lower abdominal pain and cramping.  EXAM: CT ABDOMEN AND PELVIS WITH CONTRAST  TECHNIQUE: Multidetector CT imaging of the abdomen and pelvis was performed using the standard protocol following bolus administration of intravenous contrast.  CONTRAST:  50mL OMNIPAQUE IOHEXOL 300 MG/ML SOLN, 100mL OMNIPAQUE IOHEXOL 300 MG/ML SOLN  COMPARISON:  10/07/2012 report  FINDINGS: Lung bases are clear. No pleural or pericardial fluid. Premature coronary artery calcification is noted.  There is fatty change of the liver. There has been previous cholecystectomy. The spleen is normal. Pancreas is normal. The adrenal glands are normal. The kidneys are normal. No cyst, mass, stone or hydronephrosis. The aorta shows atherosclerosis but no aneurysm. The IVC is normal. The appendix is normal. No other bowel pathology. There has been previous hysterectomy. Bladder appears unremarkable. Chronic degenerative changes affect the spine.  IMPRESSION: No cause of acute symptoms is identified.  Premature coronary artery calcification.  Fatty change of the liver.   Electronically Signed   By: Paulina FusiMark  Shogry M.D.   On: 10/12/2013 20:11     EKG Interpretation None      MDM    Final diagnoses:  Hepatitis  Nausea and vomiting  Dehydration   I personally performed the services described in this documentation, which was scribed in my presence. The recorded information has been reviewed and considered.  Erika AlbeIva Jarris Kortz, MD, Armando GangFACEP   Ward GivensIva L Keyontae Huckeby, MD 10/14/13 2033

## 2013-10-14 NOTE — ED Notes (Signed)
Pt reports severe pain to her abd. Notified edp

## 2013-10-15 ENCOUNTER — Inpatient Hospital Stay (HOSPITAL_COMMUNITY): Payer: Self-pay

## 2013-10-15 ENCOUNTER — Encounter (HOSPITAL_COMMUNITY): Payer: Self-pay | Admitting: Gastroenterology

## 2013-10-15 DIAGNOSIS — R52 Pain, unspecified: Secondary | ICD-10-CM

## 2013-10-15 DIAGNOSIS — R1084 Generalized abdominal pain: Secondary | ICD-10-CM

## 2013-10-15 LAB — HEPATIC FUNCTION PANEL
ALK PHOS: 62 U/L (ref 39–117)
ALT: 3894 U/L — AB (ref 0–35)
AST: 7738 U/L — ABNORMAL HIGH (ref 0–37)
Albumin: 3.2 g/dL — ABNORMAL LOW (ref 3.5–5.2)
BILIRUBIN DIRECT: 0.3 mg/dL (ref 0.0–0.3)
Indirect Bilirubin: 0.4 mg/dL (ref 0.3–0.9)
Total Bilirubin: 0.7 mg/dL (ref 0.3–1.2)
Total Protein: 6.4 g/dL (ref 6.0–8.3)

## 2013-10-15 LAB — HEPATITIS PANEL, ACUTE
HCV Ab: NEGATIVE
Hep A IgM: NONREACTIVE
Hep B C IgM: NONREACTIVE
Hepatitis B Surface Ag: NEGATIVE

## 2013-10-15 LAB — PROTIME-INR
INR: 1.34 (ref 0.00–1.49)
Prothrombin Time: 16.3 seconds — ABNORMAL HIGH (ref 11.6–15.2)

## 2013-10-15 LAB — COMPREHENSIVE METABOLIC PANEL
ALBUMIN: 3.3 g/dL — AB (ref 3.5–5.2)
ALK PHOS: 64 U/L (ref 39–117)
ALT: 4266 U/L — AB (ref 0–35)
BILIRUBIN TOTAL: 0.7 mg/dL (ref 0.3–1.2)
BUN: 8 mg/dL (ref 6–23)
CHLORIDE: 102 meq/L (ref 96–112)
CO2: 26 mEq/L (ref 19–32)
CREATININE: 0.59 mg/dL (ref 0.50–1.10)
Calcium: 8 mg/dL — ABNORMAL LOW (ref 8.4–10.5)
GFR calc Af Amer: >90 mL/min (ref >90)
GFR calc non Af Amer: >90 mL/min (ref >90)
Glucose, Bld: 115 mg/dL — ABNORMAL HIGH (ref 70–99)
POTASSIUM: 3.8 meq/L (ref 3.7–5.3)
Sodium: 142 mEq/L (ref 137–147)
Total Protein: 6.5 g/dL (ref 6.0–8.3)

## 2013-10-15 LAB — CBC
HCT: 43 % (ref 36.0–46.0)
Hemoglobin: 14.5 g/dL (ref 12.0–15.0)
MCH: 34.7 pg — AB (ref 26.0–34.0)
MCHC: 33.7 g/dL (ref 30.0–36.0)
MCV: 102.9 fL — AB (ref 78.0–100.0)
PLATELETS: 125 10*3/uL — AB (ref 150–400)
RBC: 4.18 MIL/uL (ref 3.87–5.11)
RDW: 11.9 % (ref 11.5–15.5)
WBC: 9.8 10*3/uL (ref 4.0–10.5)

## 2013-10-15 MED ORDER — PROMETHAZINE HCL 25 MG/ML IJ SOLN
25.0000 mg | Freq: Four times a day (QID) | INTRAMUSCULAR | Status: DC | PRN
  Administered 2013-10-15 – 2013-10-18 (×11): 25 mg via INTRAVENOUS
  Filled 2013-10-15 (×11): qty 1

## 2013-10-15 MED ORDER — ONDANSETRON HCL 4 MG/2ML IJ SOLN: 4.0000 mg | Freq: Three times a day (TID) | INTRAMUSCULAR | Status: DC | PRN

## 2013-10-15 MED ORDER — ZOLPIDEM TARTRATE 5 MG PO TABS
5.0000 mg | ORAL_TABLET | Freq: Every evening | ORAL | Status: DC | PRN
  Administered 2013-10-15 – 2013-10-17 (×4): 5 mg via ORAL
  Filled 2013-10-15 (×4): qty 1

## 2013-10-15 MED ORDER — DEXTROSE 5 % IV SOLN
15.0000 mg/kg/h | INTRAVENOUS | Status: AC
  Administered 2013-10-15 – 2013-10-16 (×2): 15 mg/kg/h via INTRAVENOUS
  Filled 2013-10-15 (×4): qty 200

## 2013-10-15 MED ORDER — SODIUM CHLORIDE 0.9 % IV SOLN
INTRAVENOUS | Status: DC
  Administered 2013-10-15 – 2013-10-17 (×4): via INTRAVENOUS

## 2013-10-15 MED ORDER — ONDANSETRON HCL 4 MG PO TABS: 4.0000 mg | ORAL_TABLET | Freq: Four times a day (QID) | ORAL | Status: DC | PRN

## 2013-10-15 MED ORDER — SODIUM CHLORIDE 0.9 % IV SOLN
INTRAVENOUS | Status: DC
  Administered 2013-10-15: 01:00:00 via INTRAVENOUS

## 2013-10-15 MED ORDER — ONDANSETRON HCL 4 MG/2ML IJ SOLN
4.0000 mg | Freq: Four times a day (QID) | INTRAMUSCULAR | Status: DC | PRN
  Administered 2013-10-15 – 2013-10-18 (×8): 4 mg via INTRAVENOUS
  Filled 2013-10-15 (×9): qty 2

## 2013-10-15 MED ORDER — ACETYLCYSTEINE LOAD VIA INFUSION
150.0000 mg/kg | Freq: Once | INTRAVENOUS | Status: AC
  Administered 2013-10-15: 15180 mg via INTRAVENOUS
  Filled 2013-10-15: qty 380

## 2013-10-15 MED ORDER — ALUM & MAG HYDROXIDE-SIMETH 200-200-20 MG/5ML PO SUSP: 30.0000 mL | Freq: Four times a day (QID) | ORAL | Status: DC | PRN

## 2013-10-15 MED ORDER — ENOXAPARIN SODIUM 40 MG/0.4ML ~~LOC~~ SOLN
40.0000 mg | SUBCUTANEOUS | Status: DC
  Administered 2013-10-15 – 2013-10-18 (×4): 40 mg via SUBCUTANEOUS
  Filled 2013-10-15 (×5): qty 0.4

## 2013-10-15 MED ORDER — OXYCODONE HCL 5 MG PO TABS: 5.0000 mg | ORAL_TABLET | ORAL | Status: DC | PRN

## 2013-10-15 MED ORDER — SODIUM CHLORIDE 0.9 % IV SOLN: INTRAVENOUS | Status: DC

## 2013-10-15 MED ORDER — NICOTINE 21 MG/24HR TD PT24
21.0000 mg | MEDICATED_PATCH | Freq: Every day | TRANSDERMAL | Status: DC
  Administered 2013-10-15 – 2013-10-18 (×4): 21 mg via TRANSDERMAL
  Filled 2013-10-15 (×4): qty 1

## 2013-10-15 NOTE — Consult Note (Addendum)
Referring Provider: Cathren Harshipudeep K Rai, MD Primary Care Physician:  No PCP Per Patient Primary Gastroenterologist:  Roetta SessionsMichael Maui Britten, MD  Reason for Consultation:  Abnormal LFTs.  HPI: Erika Rodriguez is a 48 y.o. female who presented with complaints of abdominal pain, N/V. Initially seen on 10/12/13 in the ER. Given Zofran and discharged. Patient reports symptoms continued and she came back to ER yesterday. Complains of diffuse mid-abdominal pain. Continues to have vomiting. Last time at 5am this morning. Feels thirsty. Little streak of bright red blood in emesis several days ago.  Heartburn a lot. BM infrequent, chronic constipation. Some brbpr four months ago with fecal impaction. Black stool yesterday. Has been taking Pepto-Bismol since Monday.  Initial ER evaluation on Monday, there were no LFTs drawn. Yesterday her AST was 2464, ALT 1178. Total bili 1.4. Today her AST is greater than 10,000, ALT 4266, bilirubin 0.7. Her INR yesterday was 1.41, PT 16.9. Platelets dropped from 160,000 125,000 overnight. Urine drug screen was positive for opiates and benzodiazepines. CT of the abdomen pelvis showed fatty change in the liver. Abdominal ultrasound today showed increased echogenicity in the liver most likely due to fatty change.  She admits to excessive Tylenol use over the past 6-7 months for chronic back pain. She states she has a PCP but cannot afford to go see them because she has no insurance. She also has chronic insomnia and uses Tylenol PM at nighttime. On Monday she took a total of 7 Tylenol PM's within 3 hours. She states she has hypertension but not on any blood pressure medication. Also admits to taking herbal medication for weight loss purposes and colon cleansing, the last time about a month ago. She cannot recall the name. She was taken garcinia Cambogia about 3 months ago as well.  Her weight is up 40 pounds since I saw her one year ago.  Denies history of blood transfusion, ill contacts,  tattoos, IV. Nasal drug use. 3 weeks ago she had vomiting, fever that lasted for 24 hours. Complains of 3 sores, one on the left buttocks and two under the left axilla.   Complains of fatigue.  Prior to Admission medications   Medication Sig Start Date End Date Taking? Authorizing Provider  naproxen sodium (ANAPROX) 220 MG tablet Take 440 mg by mouth 2 (two) times daily as needed (for pain).    Yes Historical Provider, MD  Tylenol 650mg  8-10 per day      Tylenol PM 3-4 each night.    Current Facility-Administered Medications  Medication Dose Route Frequency Provider Last Rate Last Dose  . 0.9 %  sodium chloride infusion   Intravenous Continuous Ripudeep K Rai, MD      . alum & mag hydroxide-simeth (MAALOX/MYLANTA) 200-200-20 MG/5ML suspension 30 mL  30 mL Oral Q6H PRN Haydee Monicaachal A David, MD      . enoxaparin (LOVENOX) injection 40 mg  40 mg Subcutaneous Q24H Haydee Monicaachal A David, MD   40 mg at 10/15/13 0900  . HYDROmorphone (DILAUDID) injection 1 mg  1 mg Intravenous Q4H PRN Haydee Monicaachal A David, MD   1 mg at 10/15/13 16100823  . nicotine (NICODERM CQ - dosed in mg/24 hours) patch 21 mg  21 mg Transdermal Daily Ripudeep K Rai, MD      . ondansetron (ZOFRAN) tablet 4 mg  4 mg Oral Q6H PRN Haydee Monicaachal A David, MD       Or  . ondansetron (ZOFRAN) injection 4 mg  4 mg Intravenous Q6H PRN Rachal A  Onalee Hua, MD   4 mg at 10/15/13 0343  . oxyCODONE (Oxy IR/ROXICODONE) immediate release tablet 5 mg  5 mg Oral Q4H PRN Haydee Monica, MD      . zolpidem (AMBIEN) tablet 5 mg  5 mg Oral QHS PRN Haydee Monica, MD   5 mg at 10/15/13 0115    Allergies as of 10/14/2013  . (No Known Allergies)    Past Medical History  Diagnosis Date  . Chronic back pain   . Hypertension     Past Surgical History  Procedure Laterality Date  . Abdominal hysterectomy    . Cholecystectomy    . Esophagogastroduodenoscopy N/A 11/05/2012    ZOX:WRUEAV hernia. Subtle mucosal abdomen obese of the esophagus, stomach and duodenum-of uncertain  significance-s/p bx    Family History  Problem Relation Age of Onset  . Ulcers Neg Hx   . Liver disease Neg Hx   . Colon cancer Neg Hx   . Crohn's disease Neg Hx   . Ulcerative colitis Neg Hx   . Diabetes Mother   . Heart failure Mother   . Stroke Sister   . Epilepsy Sister     History   Social History  . Marital Status: Single    Spouse Name: N/A    Number of Children: 0  . Years of Education: N/A   Occupational History  .      Social History Main Topics  . Smoking status: Current Every Day Smoker -- 0.50 packs/day for 20 years    Types: Cigarettes  . Smokeless tobacco: Never Used  . Alcohol Use: Yes     Comment: drink friday or saturday on occasion. just one beer.  . Drug Use: No  . Sexual Activity: Yes    Birth Control/ Protection: Surgical   Other Topics Concern  . Not on file   Social History Narrative  . No narrative on file     ROS:  General: See history of present illness Eyes: Negative for vision changes.  ENT: Negative for hoarseness, difficulty swallowing , nasal congestion. CV: Negative for chest pain, angina, palpitations, dyspnea on exertion, peripheral edema.  Respiratory: Negative for dyspnea at rest, dyspnea on exertion, cough, sputum, wheezing.  GI: See history of present illness. GU:  Negative for dysuria, hematuria, urinary incontinence, urinary frequency, nocturnal urination. Urine dark. MS: Negative for joint pain. Chronic low back pain.  Derm: Negative for rash or itching.  Neuro: Negative for weakness, abnormal sensation, seizure, frequent headaches, memory loss, confusion.  Psych: Negative for anxiety, depression, suicidal ideation, hallucinations.  Endo: Negative for unusual weight change.  Heme: Negative for bruising or bleeding. Allergy: Negative for rash or hives.       Physical Examination: Vital signs in last 24 hours: Temp:  [97.6 F (36.4 C)-99 F (37.2 C)] 99 F (37.2 C) (04/16 0526) Pulse Rate:  [66-84] 81 (04/16  0526) Resp:  [18-20] 20 (04/16 0526) BP: (123-184)/(83-102) 123/83 mmHg (04/16 0526) SpO2:  [95 %-100 %] 95 % (04/16 0526) Weight:  [180 lb (81.647 kg)-223 lb (101.152 kg)] 223 lb (101.152 kg) (04/16 0526) Last BM Date: 10/14/13  General: Well-nourished, well-developed in no acute distress. Wants to go and smoke. Head: Normocephalic, atraumatic.   Eyes: Conjunctiva pink, no icterus. Mouth: Oropharyngeal mucosa moist and pink , no lesions erythema or exudate. Neck: Supple without thyromegaly, masses, or lymphadenopathy.  Lungs: Clear to auscultation bilaterally.  Heart: Regular rate and rhythm, no murmurs rubs or gallops.  Abdomen: Bowel sounds are  normal, mild diffuse tenderness, nondistended, no hepatosplenomegaly or masses, no abdominal bruits or    hernia , no rebound or guarding.   Rectal: Not performed. On the left buttocks, evidence of previous boil, no erythema or drainage Extremities: No lower extremity edema, clubbing, deformity.  Neuro: Alert and oriented x 4 , grossly normal neurologically.  Skin: Warm and dry, no rash or jaundice.  2 small sores under the left axilla without active erythema or drainage. No induration. Psych: Alert and cooperative, normal mood and affect.        Intake/Output from previous day:   Intake/Output this shift:    Lab Results: CBC  Recent Labs  10/12/13 1521 10/14/13 1450 10/15/13 0420  WBC 5.0 8.9 9.8  HGB 15.0 16.4* 14.5  HCT 44.8 47.7* 43.0  MCV 102.8* 101.5* 102.9*  PLT 190 160 125*   BMET  Recent Labs  10/12/13 1521 10/14/13 1450 10/15/13 0420  NA 140 140 142  K 4.4 4.2 3.8  CL 98 100 102  CO2 30 24 26   GLUCOSE 115* 120* 115*  BUN 5* 7 8  CREATININE 0.62 0.58 0.59  CALCIUM 9.7 9.2 8.0*   LFT  Recent Labs  10/14/13 1450 10/15/13 0420  BILITOT 1.4* 0.7  ALKPHOS 74 64  AST 2464* >10000*  ALT 1178* 4266*  PROT 7.7 6.5  ALBUMIN 4.1 3.3*          Recent Labs  10/14/13 2005  LABPROT 16.9*  INR 1.41       Imaging Studies: Ct Abdomen Pelvis W Contrast  10/12/2013   CLINICAL DATA:  Lower abdominal pain and cramping.  EXAM: CT ABDOMEN AND PELVIS WITH CONTRAST  TECHNIQUE: Multidetector CT imaging of the abdomen and pelvis was performed using the standard protocol following bolus administration of intravenous contrast.  CONTRAST:  50mL OMNIPAQUE IOHEXOL 300 MG/ML SOLN, 100mL OMNIPAQUE IOHEXOL 300 MG/ML SOLN  COMPARISON:  10/07/2012 report  FINDINGS: Lung bases are clear. No pleural or pericardial fluid. Premature coronary artery calcification is noted.  There is fatty change of the liver. There has been previous cholecystectomy. The spleen is normal. Pancreas is normal. The adrenal glands are normal. The kidneys are normal. No cyst, mass, stone or hydronephrosis. The aorta shows atherosclerosis but no aneurysm. The IVC is normal. The appendix is normal. No other bowel pathology. There has been previous hysterectomy. Bladder appears unremarkable. Chronic degenerative changes affect the spine.  IMPRESSION: No cause of acute symptoms is identified.  Premature coronary artery calcification.  Fatty change of the liver.   Electronically Signed   By: Paulina FusiMark  Shogry M.D.   On: 10/12/2013 20:11  [4 week]  ABD U/S: CLINICAL DATA: Abdominal pain with elevated liver enzymes  EXAM:  ULTRASOUND ABDOMEN COMPLETE  COMPARISON: None.  FINDINGS:  Gallbladder:  Surgically absent.  Common bile duct:  Diameter: 7 mm. There is no appreciable mass or calculus seen in the  biliary ductal system.  Liver:  No focal lesion identified. Liver echogenicity is increased.  IVC:  No abnormality visualized.  Pancreas:  Visualized portion unremarkable. Portions of pancreas obscured by  gas.  Spleen:  Size and appearance within normal limits.  Right Kidney:  Length: 11.5 cm. Echogenicity within normal limits. No mass or  hydronephrosis visualized.  Left Kidney:  Length: 12.1 cm. Echogenicity within normal limits. No mass or   hydronephrosis visualized.  Abdominal aorta:  No aneurysm visualized.  Other findings:  No demonstrable ascites.  IMPRESSION:  Gallbladder absent. Increased echogenicity in the liver  is most  likely due to fatty change. While no focal liver lesions are  identified, it must be cautioned that the sensitivity of ultrasound  for focal liver lesions is diminished given underlying fatty change.  Portions of the pancreas are obscured by gas. Visualized portions of  pancreas appear normal. Study otherwise unremarkable.  Electronically Signed  By: Bretta Bang M.D.  On: 10/15/2013 09:13        Impression: 48 y/o lady who presents with acute onset abdominal pain associated with vomiting. Transaminases are significantly elevated with significant increase over night. In the differential list would be acetaminophen toxicity plus or minus synergistic effect with herbal medications for weight loss, autoimmune hepatitis, viral hepatitis. Unlikely ischemia or biliary. I have discussed at length with Dr. Jena Gauss and pharmacist, Audelia Acton. Patient reports last acetaminophen use 2 days prior to admission. Yesterday had unremarkable acetaminophen level. Given patient's significant history, would recommend acetylcysteine therapy. Patient continues to have vomiting therefore IV formulation would be recommended. At this point potential benefit of medication outweighs the risk as it appears that she has had significant acute injury to her liver. We will go ahead and pursue workup of autoimmune causes as well.  Plan: 1. Pharmacy consult for acetylcysteine therapy 2. IV hydration and supportive measures for vomiting. 3. Recheck her INR today. Recheck labs tomorrow. Also check autoimmune hepatitis labs, ceruloplasmin. Followup viral markers is available. We'll add HCV RNA to help rule out acute Hep C.  We would like to thank you for the opportunity to participate in the care of Gelila C Lockamy.   LOS: 1 day   Tiffany Kocher  10/15/2013, 10:11 AM  Attending note:  Patient seen and examined. Data reviewed. Patient most likely has acute drug-induced or toxin mediated hepatitis. Acetaminophen hepatotoxicity may be at play here even with the acetaminophen level within the normal limits. I fully agree with Mucomyst therapy. This afternoon she seems appropriate ; does not have asterixis. Urgent transfer to a liver transplant Center not needed at this time. However, we'll reevaluate tomorrow morning.

## 2013-10-15 NOTE — Care Management Utilization Note (Signed)
UR completed 

## 2013-10-15 NOTE — Progress Notes (Signed)
Called report to Lyda JesterMaryann Dickerson, RN.  Verbalized understanding.  Pt transferred to rm 301 in safe and stable condition. Candelaria StagersLeigh Anne Schonewitz 10/15/2013

## 2013-10-15 NOTE — Progress Notes (Signed)
Patient ID: Erika Rodriguez  female  ZOX:[id number]    DOB: 07/19/1965    DOA: 10/14/2013  PCP: No PCP Per Patient  Assessment/Plan: Principal Problem:   Acute transaminitis/Hepatitis: Presented as abdominal pain, nausea and vomiting. Patient had presented with similar symptoms on 4/13, symptoms had improved in ED hence was discharged. Likely due to Tylenol overuse, rule out other liver pathology , Tylenol level normal on admission - LFTs extremely high and worsened overnight, AST 10,000, ALT 4266, platelets 125 K, bilirubin 0.7, INR 1.4.  - CT abdomen pelvis, abdominal ultrasound shows no acute pathology except fatty change in liver. - GI consulted, recommended IV hydration, supportive measures and Mucomyst therapy. - Hepatitis panel pending, autoimmune workup was ordered  Active Problems:   Nausea with vomiting - Continue Zofran    Abdominal pain,  acute on chronic  - Likely due to #1, continue IV fluids and pain control    Chronic back pain - Placed on oral oxycodone as needed    Hypertension -Now improving   Nicotine abuse : Smokes about 2 packs per day  - Counseled strongly on nicotine cessation, placed on nicotine patch   DVT Prophylaxis:Lovenox   Code Status:Full code   Family Communication:  Disposition:  Consultants:  Gastroenterology   Procedures:  None   Antibiotics:  None    Subjective: Continues to complain of nausea and abdominal discomfort, has somewhat flushed face   Objective: Weight change:  No intake or output data in the 24 hours ending 10/15/13 1026 Blood pressure 123/83, pulse 81, temperature 99 F (37.2 C), temperature source Oral, resp. rate 20, height 5\' 3"  (1.6 m), weight 101.152 kg (223 lb), SpO2 95.00%.  Physical Exam: General: Alert and awake, oriented x3, not in any acute distress. HEENT: anicteric sclera, PERLA, EOMI, no icterus  CVS: S1-S2 clear, no murmur rubs or gallops Chest: clear to auscultation bilaterally, no  wheezing, rales or rhonchi Abdomen: soft nontender, nondistended, normal bowel sounds  Extremities: no cyanosis, clubbing or edema noted bilaterally Neuro: Cranial nerves II-XII intact, no focal neurological deficits  Lab Results: Basic Metabolic Panel:  Recent Labs Lab 10/14/13 1450 10/15/13 0420  NA 140 142  K 4.2 3.8  CL 100 102  CO2 24 26  GLUCOSE 120* 115*  BUN 7 8  CREATININE 0.58 0.59  CALCIUM 9.2 8.0*   Liver Function Tests:  Recent Labs Lab 10/14/13 1450 10/15/13 0420  AST 2464* >10000*  ALT 1178* 4266*  ALKPHOS 74 64  BILITOT 1.4* 0.7  PROT 7.7 6.5  ALBUMIN 4.1 3.3*    Recent Labs Lab 10/14/13 1450  LIPASE 17   No results found for this basename: AMMONIA,  in the last 168 hours CBC:  Recent Labs Lab 10/14/13 1450 10/15/13 0420  WBC 8.9 9.8  NEUTROABS 7.8*  --   HGB 16.4* 14.5  HCT 47.7* 43.0  MCV 101.5* 102.9*  PLT 160 125*   Cardiac Enzymes: No results found for this basename: CKTOTAL, CKMB, CKMBINDEX, TROPONINI,  in the last 168 hours BNP: No components found with this basename: POCBNP,  CBG: No results found for this basename: GLUCAP,  in the last 168 hours   Micro Results: No results found for this or any previous visit (from the past 240 hour(s)).  Studies/Results: [place] Abdomen Complete  10/15/2013   CLINICAL DATA:  Abdominal pain with elevated liver enzymes  EXAM: ULTRASOUND ABDOMEN COMPLETE  COMPARISON:  None.  FINDINGS: Gallbladder:  Surgically absent.  Common bile duct:  Diameter:  7 mm. There is no appreciable mass or calculus seen in the biliary ductal system.  Liver:  No focal lesion identified.  Liver echogenicity is increased.  IVC:  No abnormality visualized.  Pancreas:  Visualized portion unremarkable. Portions of pancreas obscured by gas.  Spleen:  Size and appearance within normal limits.  Right Kidney:  Length: 11.5 cm. Echogenicity within normal limits. No mass or hydronephrosis visualized.  Left Kidney:  Length: 12.1 cm.  Echogenicity within normal limits. No mass or hydronephrosis visualized.  Abdominal aorta:  No aneurysm visualized.  Other findings:  No demonstrable ascites.  IMPRESSION: Gallbladder absent. Increased echogenicity in the liver is most likely due to fatty change. While no focal liver lesions are identified, it must be cautioned that the sensitivity of ultrasound for focal liver lesions is diminished given underlying fatty change. Portions of the pancreas are obscured by gas. Visualized portions of pancreas appear normal. Study otherwise unremarkable.   Electronically Signed   By: Bretta BangWilliam  Woodruff M.D.   On: 10/15/2013 09:13   Ct Abdomen Pelvis W Contrast  10/12/2013   CLINICAL DATA:  Lower abdominal pain and cramping.  EXAM: CT ABDOMEN AND PELVIS WITH CONTRAST  TECHNIQUE: Multidetector CT imaging of the abdomen and pelvis was performed using the standard protocol following bolus administration of intravenous contrast.  CONTRAST:  50mL OMNIPAQUE IOHEXOL 300 MG/ML SOLN, 100mL OMNIPAQUE IOHEXOL 300 MG/ML SOLN  COMPARISON:  10/07/2012 report  FINDINGS: Lung bases are clear. No pleural or pericardial fluid. Premature coronary artery calcification is noted.  There is fatty change of the liver. There has been previous cholecystectomy. The spleen is normal. Pancreas is normal. The adrenal glands are normal. The kidneys are normal. No cyst, mass, stone or hydronephrosis. The aorta shows atherosclerosis but no aneurysm. The IVC is normal. The appendix is normal. No other bowel pathology. There has been previous hysterectomy. Bladder appears unremarkable. Chronic degenerative changes affect the spine.  IMPRESSION: No cause of acute symptoms is identified.  Premature coronary artery calcification.  Fatty change of the liver.   Electronically Signed   By: Paulina FusiMark  Shogry M.D.   On: 10/12/2013 20:11    Medications: Scheduled Meds: . acetylcysteine  150 mg/kg Intravenous Once  . enoxaparin (LOVENOX) injection  40 mg  Subcutaneous Q24H  . nicotine  21 mg Transdermal Daily      LOS: 1 day   Ripudeep Jenna LuoK Rai M.D. [facility] 10/15/2013, 10:26 AM Pager: [phone]  If 7PM-7AM, please contact night-coverage [email or web address] Password [email or web address]  **Disclaimer: This note was dictated with voice recognition software. Similar sounding words can inadvertently be transcribed and this note may contain transcription errors which may not have been corrected upon publication of note.**

## 2013-10-15 NOTE — Progress Notes (Signed)
MEDICATION RELATED CONSULT NOTE - INITIAL   Pharmacy Consult for IV Acetadote Indication: Tylenol Overdose  No Known Allergies  Patient Measurements: Height: 5\' 3"  (160 cm) Weight: 223 lb (101.152 kg) IBW/kg (Calculated) : 52.4  Vital Signs: Temp: 99 F (37.2 C) (04/16 0526) Temp src: Oral (04/16 0526) BP: 123/83 mmHg (04/16 0526) Pulse Rate: 81 (04/16 0526) Intake/Output from previous day:   Intake/Output from this shift:    Labs:  Recent Labs  10/12/13 1521 10/14/13 1450 10/14/13 2005 10/15/13 0420  WBC 5.0 8.9  --  9.8  HGB 15.0 16.4*  --  14.5  HCT 44.8 47.7*  --  43.0  PLT 190 160  --  125*  APTT  --   --  29  --   CREATININE 0.62 0.58  --  0.59  ALBUMIN  --  4.1  --  3.3*  PROT  --  7.7  --  6.5  AST  --  2464*  --  >10000*  ALT  --  1178*  --  4266*  ALKPHOS  --  74  --  64  BILITOT  --  1.4*  --  0.7   Estimated Creatinine Clearance: 99.7 ml/min (by C-G formula based on Cr of 0.59).   Microbiology: No results found for this or any previous visit (from the past 720 hour(s)).  Medical History: Past Medical History  Diagnosis Date  . Chronic back pain   . Hypertension     Medications:  Scheduled:  . acetylcysteine  150 mg/kg Intravenous Once  . enoxaparin (LOVENOX) injection  40 mg Subcutaneous Q24H  . nicotine  21 mg Transdermal Daily    Assessment: 48 yo F who presented with abdominal pain, nausea, & vomiting yesterday.  She admits to using >5gm APAP/day for chronic pain & 3-7 Tylenol PM at bedtime to help with sleep.  Acetaminophen level remains <15, however concern for rising LFTs. 4/16 AM:  [place] with [facility].  Agree with started IV Acetadote per their protocol (higher dose than package insert).  Recommended cardiac monitoring for minimum of 1st hour of infusion & to re-check labs in 24 hrs (morning labs ok).  Patient is currently not on monitor.  Spoke with Lorra HalsLeighanne, RN & she will coordinate efforts to move patient to  telemetry room.    Goal of Therapy:  Normalized LFTs, APAP level, INR  Plan:  Move to telemetry bed for at least 1st hour of infusion Start Acetadote 150mg /kg bolus x1 hr then 15mg /kg/hr infusion until recommended to stop per Poison Control Check INR now Check Cmet & INR with morning labs F/U with Poison Contol for further recommendations in am  Mercy Ridingndrea Michelle Pansy Ostrovsky 10/15/2013,11:05 AM

## 2013-10-16 DIAGNOSIS — K759 Inflammatory liver disease, unspecified: Secondary | ICD-10-CM

## 2013-10-16 DIAGNOSIS — R112 Nausea with vomiting, unspecified: Secondary | ICD-10-CM

## 2013-10-16 LAB — IGG, IGA, IGM
IGG (IMMUNOGLOBIN G), SERUM: 716 mg/dL (ref 690–1700)
IgA: 274 mg/dL (ref 69–380)
IgM, Serum: 64 mg/dL (ref 52–322)

## 2013-10-16 LAB — COMPREHENSIVE METABOLIC PANEL
ALBUMIN: 2.6 g/dL — AB (ref 3.5–5.2)
ALT: 2054 U/L — ABNORMAL HIGH (ref 0–35)
AST: 2023 U/L — ABNORMAL HIGH (ref 0–37)
Alkaline Phosphatase: 41 U/L (ref 39–117)
BUN: 3 mg/dL — ABNORMAL LOW (ref 6–23)
CO2: 28 mEq/L (ref 19–32)
CREATININE: 0.52 mg/dL (ref 0.50–1.10)
Calcium: 7.6 mg/dL — ABNORMAL LOW (ref 8.4–10.5)
Chloride: 100 mEq/L (ref 96–112)
GFR calc Af Amer: >90 mL/min (ref >90)
GFR calc non Af Amer: >90 mL/min (ref >90)
Glucose, Bld: 94 mg/dL (ref 70–99)
Potassium: 3.1 mEq/L — ABNORMAL LOW (ref 3.7–5.3)
Sodium: 140 mEq/L (ref 137–147)
TOTAL PROTEIN: 5.3 g/dL — AB (ref 6.0–8.3)
Total Bilirubin: 1 mg/dL (ref 0.3–1.2)

## 2013-10-16 LAB — PROTIME-INR
INR: 1.44 (ref 0.00–1.49)
Prothrombin Time: 17.2 seconds — ABNORMAL HIGH (ref 11.6–15.2)

## 2013-10-16 LAB — HCV RNA QUANT: HCV QUANT: NOT DETECTED [IU]/mL — AB (ref <15)

## 2013-10-16 LAB — ANA: Anti Nuclear Antibody(ANA): NEGATIVE

## 2013-10-16 LAB — ANTI-SMOOTH MUSCLE ANTIBODY, IGG: F-ACTIN AB IGG: 4 U (ref <20)

## 2013-10-16 LAB — MITOCHONDRIAL ANTIBODIES: Mitochondrial M2 Ab, IgG: 0.23 (ref <0.91)

## 2013-10-16 MED ORDER — PANTOPRAZOLE SODIUM 40 MG PO TBEC
40.0000 mg | DELAYED_RELEASE_TABLET | Freq: Every day | ORAL | Status: DC
  Administered 2013-10-16 – 2013-10-18 (×3): 40 mg via ORAL
  Filled 2013-10-16 (×3): qty 1

## 2013-10-16 NOTE — Progress Notes (Signed)
Patient ID: Erika Rodriguez  female  ZOX:[id number]    DOB: Oct 23, 1965    DOA: 10/14/2013  PCP: No PCP Per Patient  Assessment/Plan: Principal Problem:   Acute transaminitis/Hepatitis: Presented as abdominal pain, nausea and vomiting. Patient had presented with similar symptoms on 4/13, symptoms had improved in ED hence was discharged. Likely due to Tylenol overuse, rule out other liver pathology , Tylenol level normal on admission. LFTs extremely high and worsened overnight on 4/16, AST 10,000, ALT 4266, platelets 125 K, bilirubin 0.7, INR 1.4.  - CT abdomen pelvis, abdominal ultrasound shows no acute pathology except fatty change in liver. - GI consulted, recommended IV hydration, supportive measures and Mucomyst therapy due to excessive Tylenol use at home. - Hepatitis panel negative, autoimmune workup in process -LFTs improving today, AST 2023, ALT 2054, INR 1.4, total bilirubin 1.0  - If nausea vomiting is improving today, will advance diet before supper.  Active Problems:   Nausea with vomiting: Per patient improving somewhat today, had several episodes of emesis yesterday - Continue Zofran and Phenergan as needed for nausea and vomiting     Abdominal pain,  acute on chronic  - Likely due to #1, continue IV fluids and pain control    Chronic back pain - Placed on oral oxycodone as needed    Hypertension -Now improving   Nicotine abuse : Smokes about 2 packs per day  - Counseled strongly on nicotine cessation, placed on nicotine patch   DVT Prophylaxis:Lovenox   Code Status:Full code   Family Communication:  Disposition:  Consultants:  Gastroenterology - Dr Jena Gaussourk  Procedures:  None   Antibiotics:  None    Subjective:  patient reports the nausea vomiting is somewhat improved today, abdominal pain is also improving, on full liquids   Objective: Weight change: 19.822 kg (43 lb 11.2 oz)  Intake/Output Summary (Last 24 hours) at 10/16/13 0913 Last data filed at  10/16/13 0841  Gross per 24 hour  Intake   1896 ml  Output    600 ml  Net   1296 ml   Blood pressure 153/67, pulse 70, temperature 98.2 F (36.8 C), temperature source Oral, resp. rate 20, height 5\' 3"  (1.6 m), weight 102 kg (224 lb 13.9 oz), SpO2 100.00%.  Physical Exam: General: Alert and awake, oriented x3, not in any acute distress. CVS: S1-S2 clear, no murmur rubs or gallops Chest: clear to auscultation bilaterally, no wheezing, rales or rhonchi Abdomen: soft, mild diffuse tenderness , nondistended, normal bowel sounds  Extremities: no cyanosis, clubbing or edema noted bilaterally Neuro: Cranial nerves II-XII intact, no focal neurological deficits  Lab Results: Basic Metabolic Panel:  Recent Labs Lab 10/15/13 0420 10/16/13 0447  NA 142 140  K 3.8 3.1*  CL 102 100  CO2 26 28  GLUCOSE 115* 94  BUN 8 3*  CREATININE 0.59 0.52  CALCIUM 8.0* 7.6*   Liver Function Tests:  Recent Labs Lab 10/15/13 1126 10/16/13 0447  AST 7738* 2023*  ALT 3894* 2054*  ALKPHOS 62 41  BILITOT 0.7 1.0  PROT 6.4 5.3*  ALBUMIN 3.2* 2.6*    Recent Labs Lab 10/14/13 1450  LIPASE 17   No results found for this basename: AMMONIA,  in the last 168 hours CBC:  Recent Labs Lab 10/14/13 1450 10/15/13 0420  WBC 8.9 9.8  NEUTROABS 7.8*  --   HGB 16.4* 14.5  HCT 47.7* 43.0  MCV 101.5* 102.9*  PLT 160 125*   Cardiac Enzymes: No results found for  this basename: CKTOTAL, CKMB, CKMBINDEX, TROPONINI,  in the last 168 hours BNP: No components found with this basename: POCBNP,  CBG: No results found for this basename: GLUCAP,  in the last 168 hours   Micro Results: No results found for this or any previous visit (from the past 240 hour(s)).  Studies/Results: [place] Abdomen Complete  10/15/2013   CLINICAL DATA:  Abdominal pain with elevated liver enzymes  EXAM: ULTRASOUND ABDOMEN COMPLETE  COMPARISON:  None.  FINDINGS: Gallbladder:  Surgically absent.  Common bile duct:  Diameter: 7 mm.  There is no appreciable mass or calculus seen in the biliary ductal system.  Liver:  No focal lesion identified.  Liver echogenicity is increased.  IVC:  No abnormality visualized.  Pancreas:  Visualized portion unremarkable. Portions of pancreas obscured by gas.  Spleen:  Size and appearance within normal limits.  Right Kidney:  Length: 11.5 cm. Echogenicity within normal limits. No mass or hydronephrosis visualized.  Left Kidney:  Length: 12.1 cm. Echogenicity within normal limits. No mass or hydronephrosis visualized.  Abdominal aorta:  No aneurysm visualized.  Other findings:  No demonstrable ascites.  IMPRESSION: Gallbladder absent. Increased echogenicity in the liver is most likely due to fatty change. While no focal liver lesions are identified, it must be cautioned that the sensitivity of ultrasound for focal liver lesions is diminished given underlying fatty change. Portions of the pancreas are obscured by gas. Visualized portions of pancreas appear normal. Study otherwise unremarkable.   Electronically Signed   By: Bretta BangWilliam  Woodruff M.D.   On: 10/15/2013 09:13   Ct Abdomen Pelvis W Contrast  10/12/2013   CLINICAL DATA:  Lower abdominal pain and cramping.  EXAM: CT ABDOMEN AND PELVIS WITH CONTRAST  TECHNIQUE: Multidetector CT imaging of the abdomen and pelvis was performed using the standard protocol following bolus administration of intravenous contrast.  CONTRAST:  50mL OMNIPAQUE IOHEXOL 300 MG/ML SOLN, 100mL OMNIPAQUE IOHEXOL 300 MG/ML SOLN  COMPARISON:  10/07/2012 report  FINDINGS: Lung bases are clear. No pleural or pericardial fluid. Premature coronary artery calcification is noted.  There is fatty change of the liver. There has been previous cholecystectomy. The spleen is normal. Pancreas is normal. The adrenal glands are normal. The kidneys are normal. No cyst, mass, stone or hydronephrosis. The aorta shows atherosclerosis but no aneurysm. The IVC is normal. The appendix is normal. No other bowel  pathology. There has been previous hysterectomy. Bladder appears unremarkable. Chronic degenerative changes affect the spine.  IMPRESSION: No cause of acute symptoms is identified.  Premature coronary artery calcification.  Fatty change of the liver.   Electronically Signed   By: Paulina FusiMark  Shogry M.D.   On: 10/12/2013 20:11    Medications: Scheduled Meds: . enoxaparin (LOVENOX) injection  40 mg Subcutaneous Q24H  . nicotine  21 mg Transdermal Daily      LOS: 2 days   Alyanah Elliott Jenna LuoK Duvid Smalls M.D. [facility] 10/16/2013, 9:13 AM Pager: [phone]  If 7PM-7AM, please contact night-coverage [email or web address] Password [email or web address]  **Disclaimer: This note was dictated with voice recognition software. Similar sounding words can inadvertently be transcribed and this note may contain transcription errors which may not have been corrected upon publication of note.**

## 2013-10-16 NOTE — Progress Notes (Addendum)
MEDICATION RELATED CONSULT NOTE  Pharmacy Consult for IV Acetadote Indication: Tylenol Overdose  No Known Allergies  Patient Measurements: Height: 5\' 3"  (160 cm) Weight: 224 lb 13.9 oz (102 kg) IBW/kg (Calculated) : 52.4  Vital Signs: Temp: 98.2 F (36.8 C) (04/17 0459) Temp src: Oral (04/17 0459) Pulse Rate: 70 (04/17 0459) Intake/Output from previous day: 04/16 0701 - 04/17 0700 In: 1656 [I.V.:1656] Out: 400 [Urine:400] Intake/Output from this shift: Total I/O In: 240 [P.O.:240] Out: 200 [Urine:200]  Labs:  Recent Labs  10/14/13 1450 10/14/13 2005 10/15/13 0420 10/15/13 1126 10/16/13 0447  WBC 8.9  --  9.8  --   --   HGB 16.4*  --  14.5  --   --   HCT 47.7*  --  43.0  --   --   PLT 160  --  125*  --   --   APTT  --  29  --   --   --   CREATININE 0.58  --  0.59  --  0.52  ALBUMIN 4.1  --  3.3* 3.2* 2.6*  PROT 7.7  --  6.5 6.4 5.3*  AST 2464*  --  >10000* 7738* 2023*  ALT 1178*  --  4266* 3894* 2054*  ALKPHOS 74  --  64 62 41  BILITOT 1.4*  --  0.7 0.7 1.0  BILIDIR  --   --   --  0.3  --   IBILI  --   --   --  0.4  --    Estimated Creatinine Clearance: 100.2 ml/min (by C-G formula based on Cr of 0.52).   Microbiology: No results found for this or any previous visit (from the past 720 hour(s)).  Medical History: Past Medical History  Diagnosis Date  . Chronic back pain   . Hypertension     Medications:  Scheduled:  . enoxaparin (LOVENOX) injection  40 mg Subcutaneous Q24H  . nicotine  21 mg Transdermal Daily    Assessment: 48 yo F who presented with abdominal pain, nausea, & vomiting on 4/15.  She admits to using >5gm APAP/day for chronic pain & 3-7 Tylenol PM at bedtime to help with sleep.  Acetaminophen level remains <15, however concern for rising LFTs. 4/16 AM:  Spoke with [facility].  Agree with started IV Acetadote per their protocol (higher dose than package insert).  Recommended cardiac monitoring for minimum of 1st hour of  infusion & to re-check labs in 24 hrs (morning labs ok).  Patient is currently not on monitor.  Spoke with Lorra HalsLeighanne, RN & she will coordinate efforts to move patient to telemetry room.   4/17 AM:  Spoke with [facility].  LFTs trending down.  Concern for slightly increased INR.  Recommend recheck INR at 4pm & if not further increased then ok to stop Acetadote.    Goal of Therapy:  Normalized LFTs, APAP level, INR  Plan:  Continue Acetadote 15mg /kg/hr infusion  Check INR at 4pm F/U with Poison Control- current plan to stop Acetadote at 6pm unless INR trending up when checked at 4pm  [employer] Michelle Sofi Bryars 10/16/2013,10:12 AM   Wilkie AyeKristy from [facility] called back after reviewing case with toxicologist.  Since patient is otherwise improved (LFTS decreased, nausea/abdominal pain improved), MD feels the slight bump in INR is most likely related to Acetadote infusion.  Updated recommendation is to continue Acetadote until current bag expires.  No further labs needed.  Poison Control case closed.   Pharmacy to sign off.  Please re-consult if needed.   Junita PushMichelle Wei [place], PharmD, BCPS 10/16/2013@10 :43 AM

## 2013-10-16 NOTE — Progress Notes (Addendum)
subjective:  Some nausea this morning. Lot of nausea yesterday. Wants cream potatoes. Mild diffuse abdominal pain.   Objective: Vital signs in last 24 hours: Temp:  [98.2 F (36.8 C)-98.9 F (37.2 C)] 98.2 F (36.8 C) (04/17 0459) Pulse Rate:  [70-74] 70 (04/17 0459) Resp:  [20] 20 (04/17 0459) BP: (140-153)/(67-73) 153/67 mmHg (04/16 2021) SpO2:  [97 %-100 %] 100 % (04/17 0459) Weight:  [223 lb 11.2 oz (101.47 kg)-224 lb 13.9 oz (102 kg)] 224 lb 13.9 oz (102 kg) (04/17 0500) Last BM Date: 10/14/13 General:   Alert,  Well-developed, well-nourished, pleasant and cooperative in NAD Head:  Normocephalic and atraumatic. Eyes:  Sclera clear, no icterus.   Abdomen:  Soft, mild diffuse tenderness and nondistended.  Normal bowel sounds, without guarding, and without rebound.   Extremities:  Without clubbing, deformity or edema. Neurologic:  Alert and  oriented x4;  grossly normal neurologically. Skin:  Intact without significant lesions or rashes. Psych:  Alert and cooperative. Normal mood and affect.  Intake/Output from previous day: 04/16 0701 - 04/17 0700 In: 1656 [I.V.:1656] Out: 400 [Urine:400] Intake/Output this shift:    Lab Results: CBC  Recent Labs  10/14/13 1450 10/15/13 0420  WBC 8.9 9.8  HGB 16.4* 14.5  HCT 47.7* 43.0  MCV 101.5* 102.9*  PLT 160 125*   BMET  Recent Labs  10/14/13 1450 10/15/13 0420 10/16/13 0447  NA 140 142 140  K 4.2 3.8 3.1*  CL 100 102 100  CO2 24 26 28   GLUCOSE 120* 115* 94  BUN 7 8 3*  CREATININE 0.58 0.59 0.52  CALCIUM 9.2 8.0* 7.6*   LFTs  Recent Labs  10/15/13 0420 10/15/13 1126 10/16/13 0447  BILITOT 0.7 0.7 1.0  BILIDIR  --  0.3  --   IBILI  --  0.4  --   ALKPHOS 64 62 41  AST >10000* 7738* 2023*  ALT 4266* 3894* 2054*  PROT 6.5 6.4 5.3*  ALBUMIN 3.3* 3.2* 2.6*    Recent Labs  10/14/13 1450  LIPASE 17   PT/INR  Recent Labs  10/14/13 2005 10/15/13 1126 10/16/13 0447  LABPROT 16.9* 16.3* 17.2*   INR 1.41 1.34 1.44      Imaging Studies: [place] Abdomen Complete  10/15/2013   CLINICAL DATA:  Abdominal pain with elevated liver enzymes  EXAM: ULTRASOUND ABDOMEN COMPLETE  COMPARISON:  None.  FINDINGS: Gallbladder:  Surgically absent.  Common bile duct:  Diameter: 7 mm. There is no appreciable mass or calculus seen in the biliary ductal system.  Liver:  No focal lesion identified.  Liver echogenicity is increased.  IVC:  No abnormality visualized.  Pancreas:  Visualized portion unremarkable. Portions of pancreas obscured by gas.  Spleen:  Size and appearance within normal limits.  Right Kidney:  Length: 11.5 cm. Echogenicity within normal limits. No mass or hydronephrosis visualized.  Left Kidney:  Length: 12.1 cm. Echogenicity within normal limits. No mass or hydronephrosis visualized.  Abdominal aorta:  No aneurysm visualized.  Other findings:  No demonstrable ascites.  IMPRESSION: Gallbladder absent. Increased echogenicity in the liver is most likely due to fatty change. While no focal liver lesions are identified, it must be cautioned that the sensitivity of ultrasound for focal liver lesions is diminished given underlying fatty change. Portions of the pancreas are obscured by gas. Visualized portions of pancreas appear normal. Study otherwise unremarkable.   Electronically Signed   By: Bretta BangWilliam  Woodruff M.D.   On: 10/15/2013 09:13   Ct Abdomen Pelvis W  Contrast  10/12/2013   CLINICAL DATA:  Lower abdominal pain and cramping.  EXAM: CT ABDOMEN AND PELVIS WITH CONTRAST  TECHNIQUE: Multidetector CT imaging of the abdomen and pelvis was performed using the standard protocol following bolus administration of intravenous contrast.  CONTRAST:  50mL OMNIPAQUE IOHEXOL 300 MG/ML SOLN, 100mL OMNIPAQUE IOHEXOL 300 MG/ML SOLN  COMPARISON:  10/07/2012 report  FINDINGS: Lung bases are clear. No pleural or pericardial fluid. Premature coronary artery calcification is noted.  There is fatty change of the liver. There  has been previous cholecystectomy. The spleen is normal. Pancreas is normal. The adrenal glands are normal. The kidneys are normal. No cyst, mass, stone or hydronephrosis. The aorta shows atherosclerosis but no aneurysm. The IVC is normal. The appendix is normal. No other bowel pathology. There has been previous hysterectomy. Bladder appears unremarkable. Chronic degenerative changes affect the spine.  IMPRESSION: No cause of acute symptoms is identified.  Premature coronary artery calcification.  Fatty change of the liver.   Electronically Signed   By: Paulina FusiMark  Shogry M.D.   On: 10/12/2013 20:11  [2 weeks]   Assessment: 48 y/o lady who presents with acute onset abdominal pain associated with vomiting. Significant transaminitis. In the differential list would be acetaminophen toxicity plus or minus synergistic effect with herbal medications for weight loss, autoimmune hepatitis, viral hepatitis. Unlikely ischemia or biliary. She is on IV acetylcysteine therapy. Her LFTs are improving. INR a little more increased. Viral markers were negative. Other labs pending. Continues to have some vomiting. Wants small portion of cream potatoes.   Plan: 1. Complete acetylcysteine per pharmacy/poison control 2. Trial of full liquids, may have to back down to clears if persistent vomiting. 3. Continue antiemetics/supportive measures.  4. Follow up on pending labs. Recheck INR, LFTs in AM.  5. Add PPI.   LOS: 2 days   Tiffany KocherLeslie S Lewis  10/16/2013, 7:51 AM   Attending note:  Patient seen and examined this evening. States right upper quadrant abdominal pain nausea and vomiting have improved. Plummeting aminotransferases is reassuring.   Suggest followup on pending markers. If her pain is controlled and she is tolerating her diet, she could be discharged in the next 24 hours with close interval outpatient followup. Would avoid herbal and acetaminophen products for the time being. Dr. Cathie Beamsehmans will be available on-call  over the weekend if needed.

## 2013-10-17 DIAGNOSIS — F17200 Nicotine dependence, unspecified, uncomplicated: Secondary | ICD-10-CM

## 2013-10-17 DIAGNOSIS — R7989 Other specified abnormal findings of blood chemistry: Secondary | ICD-10-CM

## 2013-10-17 DIAGNOSIS — K5900 Constipation, unspecified: Secondary | ICD-10-CM

## 2013-10-17 DIAGNOSIS — Z720 Tobacco use: Secondary | ICD-10-CM

## 2013-10-17 DIAGNOSIS — K759 Inflammatory liver disease, unspecified: Secondary | ICD-10-CM

## 2013-10-17 DIAGNOSIS — K716 Toxic liver disease with hepatitis, not elsewhere classified: Secondary | ICD-10-CM

## 2013-10-17 DIAGNOSIS — R1084 Generalized abdominal pain: Secondary | ICD-10-CM

## 2013-10-17 LAB — COMPREHENSIVE METABOLIC PANEL
ALT: 1177 U/L — AB (ref 0–35)
AST: 391 U/L — ABNORMAL HIGH (ref 0–37)
Albumin: 2.6 g/dL — ABNORMAL LOW (ref 3.5–5.2)
Alkaline Phosphatase: 46 U/L (ref 39–117)
BUN: <3 mg/dL — ABNORMAL LOW (ref 6–23)
CHLORIDE: 102 meq/L (ref 96–112)
CO2: 27 meq/L (ref 19–32)
Calcium: 7.5 mg/dL — ABNORMAL LOW (ref 8.4–10.5)
Creatinine, Ser: 0.49 mg/dL — ABNORMAL LOW (ref 0.50–1.10)
GFR calc Af Amer: >90 mL/min (ref >90)
Glucose, Bld: 92 mg/dL (ref 70–99)
Potassium: 2.8 mEq/L — CL (ref 3.7–5.3)
Sodium: 140 mEq/L (ref 137–147)
Total Bilirubin: 0.6 mg/dL (ref 0.3–1.2)
Total Protein: 5.3 g/dL — ABNORMAL LOW (ref 6.0–8.3)

## 2013-10-17 LAB — PROTIME-INR
INR: 1.11 (ref 0.00–1.49)
Prothrombin Time: 14.1 seconds (ref 11.6–15.2)

## 2013-10-17 LAB — MAGNESIUM: Magnesium: 1.6 mg/dL (ref 1.5–2.5)

## 2013-10-17 MED ORDER — POTASSIUM CHLORIDE IN NACL 20-0.9 MEQ/L-% IV SOLN
INTRAVENOUS | Status: DC
  Administered 2013-10-17 – 2013-10-18 (×3): via INTRAVENOUS

## 2013-10-17 MED ORDER — POTASSIUM CHLORIDE CRYS ER 20 MEQ PO TBCR
40.0000 meq | EXTENDED_RELEASE_TABLET | Freq: Once | ORAL | Status: AC
  Administered 2013-10-17: 40 meq via ORAL
  Filled 2013-10-17: qty 2

## 2013-10-17 MED ORDER — SODIUM CHLORIDE 0.9 % IV SOLN: INTRAVENOUS | Status: DC

## 2013-10-17 MED ORDER — POTASSIUM CHLORIDE 10 MEQ/100ML IV SOLN
10.0000 meq | INTRAVENOUS | Status: AC
  Administered 2013-10-17 (×4): 10 meq via INTRAVENOUS

## 2013-10-17 MED ORDER — POTASSIUM CHLORIDE 10 MEQ/100ML IV SOLN
INTRAVENOUS | Status: AC
  Filled 2013-10-17: qty 400

## 2013-10-17 NOTE — Progress Notes (Addendum)
Subjective; Patient has multiple complaints and wants to be checked for MS. She complains of weakness imbalance change in her taste and dizziness. She states her brother has MS. He also complains of weakness. She complains of nausea and is taking promethazine before each meal. She complains of generalized abdominal pain. She states she been taking herbal medications in order to lose weight. She states she's been doing this all her life.  Objective; BP 147/64  Pulse 67  Temp(Src) 97.9 F (36.6 C) (Oral)  Resp 16  Ht 5\' 3"  (1.6 m)  Wt 228 lb 1.6 oz (103.465 kg)  BMI 40.42 kg/m2  SpO2 100% Patient is alert and in no acute distress. Abdomen is full but soft with mild generalized tenderness but no organomegaly or masses. No LE edema noted.  Lab data; Serum potassium 2.8; BUN less than 3. Creatinine 0.49 Bilirubin 0.6, AP 46, AST 391, ALT 1177, albumin 2.6, Serum calcium 7.5. INR is 1.11  Assessment; #1. 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 Continued improvement in transaminases; serum albumin should start coming up as hepatic function improves. #2. Hypokalemia. She is getting runs of IV potassium. Will check serum magnesium. #3. Patient has multiple known GI symptoms and wants to be checked for MS which I will leave to Dr. Arbutus Leasat. #4. Thrombocytopenia. Platelet count two days ago was 125K. thrombocytopenia may also be due to the toxic effects of herbals she was taking. Will check platelet count in a.m.  Recommendations; Check serum magnesium. Repeat LFTs in a.m. CBC in a.m.

## 2013-10-17 NOTE — Progress Notes (Signed)
CRITICAL VALUE ALERT  Critical value received:  Potassium of 2.8  Date of notification:  10/17/13  Time of notification:  0735  Critical value read back:yes  Nurse who received alert:  Sammuel BailiffSarah Heath, RN   MD notified (1st page):  Dr. Arbutus Leasat  Time of first page:  [phone]  MD notified (2nd page):  Time of second page:  Responding MD:    Time MD responded:

## 2013-10-17 NOTE — Progress Notes (Signed)
PROGRESS NOTE  Erika Rodriguez MWN:[id number] DOB: 05-20-1966 DOA: 10/14/2013 PCP: No PCP Per Patient  Assessment/Plan: Acute toxic Hepatitis/Transaminaemia:  -likely due to acetaminophen and herbal supplements -Presented as abdominal pain, nausea and vomiting.  -presented with similar symptoms on 4/13, symptoms had improved in ED hence was discharged. Likely due to   -Tylenol level normal on admission.  -LFTs peaked on 4/16, AST 10,000, ALT 4266, platelets 125 K, bilirubin 0.7, INR 1.4.  - CT abdomen pelvis, abdominal ultrasound shows no acute pathology except fatty change in liver. -Patient was given acetylcysteine from 10/16/2015- 10/16/2013  - GI consulted, recommended IV hydration, supportive measures and Mucomyst therapy due to excessive Tylenol use at home.  - autoimmune workup--negative -LFTs continue to improve, AST 2023-->391, ALT 2054-->1177, INR 1.1, total bilirubin 1.0-->0.6  -Viral hepatitis serologies negative -HIV antibody - advance diet Active Problems:  Recurrent Nausea with vomiting:  -Per patient improving  -But continues to have intermittent vomiting - Continue Zofran and Phenergan as needed for nausea and vomiting  Abdominal pain, acute on chronic  - Likely due to #1, continue IV fluids and pain control  -11/06/1998 410 EGD revealed antral erosions as well as fine erosions in the duodenal mucosa  Chronic back pain  - Placed on oral oxycodone as needed  Hypertension  -Now improving  -Elevated blood pressure probably due to pain  Nicotine abuse :  -Smokes about 2 packs per day  - Counseled strongly on nicotine cessation, placed on nicotine patch  Dizziness, sensory disturbance -Chronic -Outpatient workup Hypokalemia -Magnesium 1.6 -Likely due to decreased by mouth intake and fluid resuscitation -Replete Family Communication:   none Disposition Plan:   Home when cleared by GI      Procedures/Studies: [place] Abdomen Complete  10/15/2013    CLINICAL DATA:  Abdominal pain with elevated liver enzymes  EXAM: ULTRASOUND ABDOMEN COMPLETE  COMPARISON:  None.  FINDINGS: Gallbladder:  Surgically absent.  Common bile duct:  Diameter: 7 mm. There is no appreciable mass or calculus seen in the biliary ductal system.  Liver:  No focal lesion identified.  Liver echogenicity is increased.  IVC:  No abnormality visualized.  Pancreas:  Visualized portion unremarkable. Portions of pancreas obscured by gas.  Spleen:  Size and appearance within normal limits.  Right Kidney:  Length: 11.5 cm. Echogenicity within normal limits. No mass or hydronephrosis visualized.  Left Kidney:  Length: 12.1 cm. Echogenicity within normal limits. No mass or hydronephrosis visualized.  Abdominal aorta:  No aneurysm visualized.  Other findings:  No demonstrable ascites.  IMPRESSION: Gallbladder absent. Increased echogenicity in the liver is most likely due to fatty change. While no focal liver lesions are identified, it must be cautioned that the sensitivity of ultrasound for focal liver lesions is diminished given underlying fatty change. Portions of the pancreas are obscured by gas. Visualized portions of pancreas appear normal. Study otherwise unremarkable.   Electronically Signed   By: Bretta BangWilliam  Woodruff M.D.   On: 10/15/2013 09:13   Ct Abdomen Pelvis W Contrast  10/12/2013   CLINICAL DATA:  Lower abdominal pain and cramping.  EXAM: CT ABDOMEN AND PELVIS WITH CONTRAST  TECHNIQUE: Multidetector CT imaging of the abdomen and pelvis was performed using the standard protocol following bolus administration of intravenous contrast.  CONTRAST:  50mL OMNIPAQUE IOHEXOL 300 MG/ML SOLN, 100mL OMNIPAQUE IOHEXOL 300 MG/ML SOLN  COMPARISON:  10/07/2012 report  FINDINGS: Lung bases are clear. No pleural or pericardial fluid. Premature  coronary artery calcification is noted.  There is fatty change of the liver. There has been previous cholecystectomy. The spleen is normal. Pancreas is normal. The  adrenal glands are normal. The kidneys are normal. No cyst, mass, stone or hydronephrosis. The aorta shows atherosclerosis but no aneurysm. The IVC is normal. The appendix is normal. No other bowel pathology. There has been previous hysterectomy. Bladder appears unremarkable. Chronic degenerative changes affect the spine.  IMPRESSION: No cause of acute symptoms is identified.  Premature coronary artery calcification.  Fatty change of the liver.   Electronically Signed   By: Paulina FusiMark  Shogry M.D.   On: 10/12/2013 20:11         Subjective:  patient is feeling better. Continues to have intermittent vomiting. She wants to eat real food. Denies any chest pain, shortness breath, cough, hemoptysis, dysuria, hematuria.  Objective: Filed Vitals:   10/16/13 0500 10/16/13 1447 10/16/13 2231 10/17/13 0553  BP:  129/79 116/65 147/64  Pulse:  61 66 67  Temp:  98.3 F (36.8 C) 98.2 F (36.8 C) 97.9 F (36.6 C)  TempSrc:  Oral Oral Oral  Resp:  20 21 16   Height:      Weight: 102 kg (224 lb 13.9 oz)   103.465 kg (228 lb 1.6 oz)  SpO2:  99% 98% 100%    Intake/Output Summary (Last 24 hours) at 10/17/13 1121 Last data filed at 10/17/13 1100  Gross per 24 hour  Intake   3070 ml  Output   1400 ml  Net   1670 ml   Weight change: 1.996 kg (4 lb 6.4 oz) Exam:   General:  Pt is alert, follows commands appropriately, not in acute distress  HEENT: No icterus, No thrush, /AT  Cardiovascular: RRR, S1/S2, no rubs, no gallops  Respiratory: diminished breath sounds at the bases but clear to auscultation. No wheezing.   Abdomen: Soft/+BS, diffusely tender without any guarding or peritoneal signs non distended, no guarding  Extremities: trace L# edema, No lymphangitis, No petechiae, No rashes, no synovitis  Data Reviewed: Basic Metabolic Panel:  Recent Labs Lab 10/12/13 1521 10/14/13 1450 10/15/13 0420 10/16/13 0447 10/17/13 0550 10/17/13 0551  NA 140 140 142 140  --  140  K 4.4 4.2 3.8 3.1*   --  2.8*  CL 98 100 102 100  --  102  CO2 30 24 26 28   --  27  GLUCOSE 115* 120* 115* 94  --  92  BUN 5* 7 8 3*  --  <3*  CREATININE 0.62 0.58 0.59 0.52  --  0.49*  CALCIUM 9.7 9.2 8.0* 7.6*  --  7.5*  MG  --   --   --   --  1.6  --    Liver Function Tests:  Recent Labs Lab 10/14/13 1450 10/15/13 0420 10/15/13 1126 10/16/13 0447 10/17/13 0551  AST 2464* >10000* 7738* 2023* 391*  ALT 1178* 4266* 3894* 2054* 1177*  ALKPHOS 74 64 62 41 46  BILITOT 1.4* 0.7 0.7 1.0 0.6  PROT 7.7 6.5 6.4 5.3* 5.3*  ALBUMIN 4.1 3.3* 3.2* 2.6* 2.6*    Recent Labs Lab 10/14/13 1450  LIPASE 17   No results found for this basename: AMMONIA,  in the last 168 hours CBC:  Recent Labs Lab 10/12/13 1521 10/14/13 1450 10/15/13 0420  WBC 5.0 8.9 9.8  NEUTROABS 2.9 7.8*  --   HGB 15.0 16.4* 14.5  HCT 44.8 47.7* 43.0  MCV 102.8* 101.5* 102.9*  PLT 190 160 125*  Cardiac Enzymes: No results found for this basename: CKTOTAL, CKMB, CKMBINDEX, TROPONINI,  in the last 168 hours BNP: No components found with this basename: POCBNP,  CBG: No results found for this basename: GLUCAP,  in the last 168 hours  No results found for this or any previous visit (from the past 240 hour(s)).   Scheduled Meds: . enoxaparin (LOVENOX) injection  40 mg Subcutaneous Q24H  . nicotine  21 mg Transdermal Daily  . pantoprazole  40 mg Oral Daily  . potassium chloride  10 mEq Intravenous Q1 Hr x 4  . potassium chloride       Continuous Infusions: . 0.9 % NaCl with KCl 20 mEq / L 100 mL/hr at 10/17/13 0800     Catarina Hartshorn, DO  [facility] Pager [phone]  If 7PM-7AM, please contact night-coverage [email or web address] Password [email or web address] 10/17/2013, 11:21 AM   LOS: 3 days

## 2013-10-18 LAB — COMPREHENSIVE METABOLIC PANEL
ALT: 810 U/L — ABNORMAL HIGH (ref 0–35)
AST: 153 U/L — ABNORMAL HIGH (ref 0–37)
Albumin: 2.6 g/dL — ABNORMAL LOW (ref 3.5–5.2)
Alkaline Phosphatase: 48 U/L (ref 39–117)
CHLORIDE: 103 meq/L (ref 96–112)
CO2: 26 meq/L (ref 19–32)
CREATININE: 0.56 mg/dL (ref 0.50–1.10)
Calcium: 8.2 mg/dL — ABNORMAL LOW (ref 8.4–10.5)
GFR calc Af Amer: >90 mL/min (ref >90)
Glucose, Bld: 99 mg/dL (ref 70–99)
Potassium: 3.9 mEq/L (ref 3.7–5.3)
Sodium: 140 mEq/L (ref 137–147)
Total Bilirubin: 0.4 mg/dL (ref 0.3–1.2)
Total Protein: 5.3 g/dL — ABNORMAL LOW (ref 6.0–8.3)

## 2013-10-18 LAB — CBC
HCT: 35.5 % — ABNORMAL LOW (ref 36.0–46.0)
Hemoglobin: 11.8 g/dL — ABNORMAL LOW (ref 12.0–15.0)
MCH: 34.8 pg — AB (ref 26.0–34.0)
MCHC: 33.2 g/dL (ref 30.0–36.0)
MCV: 104.7 fL — ABNORMAL HIGH (ref 78.0–100.0)
PLATELETS: 126 10*3/uL — AB (ref 150–400)
RBC: 3.39 MIL/uL — AB (ref 3.87–5.11)
RDW: 12.6 % (ref 11.5–15.5)
WBC: 5.1 10*3/uL (ref 4.0–10.5)

## 2013-10-18 LAB — HIV ANTIBODY (ROUTINE TESTING W REFLEX): HIV: NONREACTIVE

## 2013-10-18 MED ORDER — AMITRIPTYLINE HCL 25 MG PO TABS: 25.0000 mg | ORAL_TABLET | Freq: Every day | ORAL | Status: DC

## 2013-10-18 NOTE — Progress Notes (Signed)
Subjective; Patient remains with multiple complaints. She continues to complain of generalized abdominal pain. She has very good appetite. She ate all of her breakfast. She denies nausea vomiting melena or rectal bleeding. Objective; BP 151/76  Pulse 72  Temp(Src) 97.3 F (36.3 C) (Oral)  Resp 14  Ht 5\' 3"  (1.6 m)  Wt 237 lb 1.6 oz (107.548 kg)  BMI 42.01 kg/m2  SpO2 100% Patient is alert and in no acute distress. Abdomen is full but soft and nontender without organomegaly or masses.  Lab data; WBC 5.1, H&H 11.8 and 35.5 and platelet count is 126K MCV is 104.7 Serum potassium 3.6. Bilirubin 0.4, AP 48, AST 153, ALT 18, albumin 2.6 ANA is negative. Serum magnesium 1.6  Assessment; #1. Acute hepatitis felt to be toxic from herbal medications and acetaminophen.  Continued and rapid decline in transaminases reassuring. No CT criteria for cirrhosis but she does have mild thrombocytopenia and macrocytosis. She appears stable for discharge from GI standpoint. #2. Hypokalemia resolved. #3. Mild thrombocytopenia. It remains to be seen whether this is a marker for chronic liver disease but also the results of toxic effects of herbals on bone marrow.  Recommendations; Patient can take MiraLax and fiber supplement for constipation. Will arrange for her to be seen in followup at Dr. Luvenia Starchourk's office.

## 2013-10-18 NOTE — Discharge Summary (Signed)
Physician Discharge Summary  Erika Rodriguez ZOX:[id number] DOB: 01/11/1966 DOA: 10/14/2013  PCP: No PCP Per Patient  Admit date: 10/14/2013 Discharge date: 10/18/2013  Recommendations for Outpatient Follow-up:  1. Pt will need to follow up with PCP in 2 weeks post discharge 2. Please obtain BMP to evaluate electrolytes and kidney function 3. Please also check CBC to evaluate Hg and Hct levels   Discharge Diagnoses:  Acute toxic Hepatitis/Transaminaemia:  -likely due to acetaminophen and herbal supplements  -Presented as abdominal pain, nausea and vomiting.  -presented with similar symptoms on 4/13, symptoms had improved in ED hence was discharged. Likely due to  -Tylenol level normal on admission.  -LFTs peaked on 4/16, AST 10,000, ALT 4266, platelets 125 K, bilirubin 0.7, INR 1.4.  - CT abdomen pelvis, abdominal ultrasound shows no acute pathology except fatty change in liver.  -Patient was given acetylcysteine from 10/16/2015- 10/16/2013  - GI consulted, recommended IV hydration, supportive measures and Mucomyst therapy due to excessive Tylenol use at home.  - autoimmune workup--negative  -LFTs continue to improve, AST 2023-->391-->153, ALT 2054-->1177-->810, INR 1.1, total bilirubin 1.0-->0.6-->0.4 -Viral hepatitis serologies negative  -HIV antibody--neg - advance diet--pt tolerated well -f/u Dr. Jena Gauss as outpt.  Discussed with Dr. Karilyn Cota on day of d/c  Recurrent Nausea with vomiting:  -Per patient improving  -But continues to have intermittent vomiting which has improved -tolerating diet on day of d/c  - Received Zofran and Phenergan as needed for nausea and vomiting  Abdominal pain, acute on chronic  - Likely due to #1, continue IV fluids and pain control  -11/05/2012 EGD revealed antral erosions as well as fine erosions in the duodenal mucosa  Chronic back pain  - Placed on oral oxycodone as needed  Hypertension  -Now improving  -Elevated blood pressure probably due to  pain -f/u PCP for continued monitoring  Nicotine abuse :  -Smokes about 2 packs per day  - Counseled strongly on nicotine cessation, placed on nicotine patch  Dizziness, sensory disturbance  -Chronic  -Outpatient workup  Hypokalemia  -Magnesium 1.6  -Likely due to decreased by mouth intake and fluid resuscitation  -Repleted Thrombocytopenia -stable -f/u outpt -may be a marker of chronic underlying liver disease  Discharge Condition: stable  Disposition: home  Diet:heart healthy Wt Readings from Last 3 Encounters:  10/18/13 107.548 kg (237 lb 1.6 oz)  10/12/13 83.008 kg (183 lb)  03/11/13 83.008 kg (183 lb)    History of present illness:  48 yo female h/o chronic generalized abd pain for past year with neg w/u (sees dr Jena Gauss) comes in with worse abd pain with associated n/v for past 2 days. Pain in crampy and general. No diarrhea. She says she has been having subjective fevers and chills for at least 2 months at night. Vomit is nonbloody. N/v is new symptom for past 2 days prior to admission. No yellowish discoloration of skin. She has been having general malaise for at least 2 weeks. She has been taking much more tylenol than normal sometimes up to over 5 gms of tyelonal a day for chronic pain issues. She also takes 4 tylenol pm at night for her insomnia. Denies etoh or any other drug abuse. At admission, the patient's AST trended up to greater than 10,000. ALT was up to 4266. Gastroenterology was consulted. CT of the abdomen and pelvis was significant only for fatty liver. Abdominal ultrasound-any acute abnormalities except for fatty liver. Autoimmune workup was negative. Viral hepatitis and HIV serologies were negative. Acetaminophen  level was negative, but the patient did receive acetylcysteine from 10/15/2013 until 10/16/2013. The patient clinically improved with conservative therapy consisting of fluids, bowel rest, pain medications. Her diet was advanced and she was able to  tolerate it well. The case was discussed with gastroenterology, Dr. Karilyn Cotaehman whom cleared her for d/c.      Consultants: GI--Dr. Elder Loveourk/Rehman  Discharge Exam: Filed Vitals:   10/18/13 0556  BP: 151/76  Pulse: 72  Temp: 97.3 F (36.3 C)  Resp: 14   Filed Vitals:   10/17/13 0553 10/17/13 1442 10/17/13 2218 10/18/13 0556  BP: 147/64 119/73 117/68 151/76  Pulse: 67 72 68 72  Temp: 97.9 F (36.6 C) 98.3 F (36.8 C) 98.2 F (36.8 C) 97.3 F (36.3 C)  TempSrc: Oral Oral Oral Oral  Resp: 16 20 21 14   Height:      Weight: 103.465 kg (228 lb 1.6 oz)   107.548 kg (237 lb 1.6 oz)  SpO2: 100% 100% 100% 100%   General: A&O x 3, NAD, pleasant, cooperative Cardiovascular: RRR, no rub, no gallop, no S3 Respiratory: CTAB, no wheeze, no rhonchi Abdomen:soft, mild diffuse tenderness without guarding or peritoneal signs, nondistended, positive bowel sounds Extremities: trace LE edema, No lymphangitis, no petechiae  Discharge Instructions      Discharge Orders   Future Orders Complete By Expires   Diet - low sodium heart healthy  As directed    Discharge instructions  As directed    Increase activity slowly  As directed        Medication List    STOP taking these medications       naproxen sodium 220 MG tablet  Commonly known as:  ANAPROX      TAKE these medications       amitriptyline 25 MG tablet  Commonly known as:  ELAVIL  Take 1 tablet (25 mg total) by mouth at bedtime.     ondansetron 4 MG disintegrating tablet  Commonly known as:  ZOFRAN ODT  Take 1 tablet (4 mg total) by mouth every 8 (eight) hours as needed for nausea.         The results of significant diagnostics from this hospitalization (including imaging, microbiology, ancillary and laboratory) are listed below for reference.    Significant Diagnostic Studies: [place] Abdomen Complete  10/15/2013   CLINICAL DATA:  Abdominal pain with elevated liver enzymes  EXAM: ULTRASOUND ABDOMEN COMPLETE  COMPARISON:   None.  FINDINGS: Gallbladder:  Surgically absent.  Common bile duct:  Diameter: 7 mm. There is no appreciable mass or calculus seen in the biliary ductal system.  Liver:  No focal lesion identified.  Liver echogenicity is increased.  IVC:  No abnormality visualized.  Pancreas:  Visualized portion unremarkable. Portions of pancreas obscured by gas.  Spleen:  Size and appearance within normal limits.  Right Kidney:  Length: 11.5 cm. Echogenicity within normal limits. No mass or hydronephrosis visualized.  Left Kidney:  Length: 12.1 cm. Echogenicity within normal limits. No mass or hydronephrosis visualized.  Abdominal aorta:  No aneurysm visualized.  Other findings:  No demonstrable ascites.  IMPRESSION: Gallbladder absent. Increased echogenicity in the liver is most likely due to fatty change. While no focal liver lesions are identified, it must be cautioned that the sensitivity of ultrasound for focal liver lesions is diminished given underlying fatty change. Portions of the pancreas are obscured by gas. Visualized portions of pancreas appear normal. Study otherwise unremarkable.   Electronically Signed   By: Bretta BangWilliam  Woodruff M.D.  On: 10/15/2013 09:13   Ct Abdomen Pelvis W Contrast  10/12/2013   CLINICAL DATA:  Lower abdominal pain and cramping.  EXAM: CT ABDOMEN AND PELVIS WITH CONTRAST  TECHNIQUE: Multidetector CT imaging of the abdomen and pelvis was performed using the standard protocol following bolus administration of intravenous contrast.  CONTRAST:  50mL OMNIPAQUE IOHEXOL 300 MG/ML SOLN, 100mL OMNIPAQUE IOHEXOL 300 MG/ML SOLN  COMPARISON:  10/07/2012 report  FINDINGS: Lung bases are clear. No pleural or pericardial fluid. Premature coronary artery calcification is noted.  There is fatty change of the liver. There has been previous cholecystectomy. The spleen is normal. Pancreas is normal. The adrenal glands are normal. The kidneys are normal. No cyst, mass, stone or hydronephrosis. The aorta shows  atherosclerosis but no aneurysm. The IVC is normal. The appendix is normal. No other bowel pathology. There has been previous hysterectomy. Bladder appears unremarkable. Chronic degenerative changes affect the spine.  IMPRESSION: No cause of acute symptoms is identified.  Premature coronary artery calcification.  Fatty change of the liver.   Electronically Signed   By: Paulina FusiMark  Shogry M.D.   On: 10/12/2013 20:11     Microbiology: No results found for this or any previous visit (from the past 240 hour(s)).   Labs: Basic Metabolic Panel:  Recent Labs Lab 10/14/13 1450 10/15/13 0420 10/16/13 0447 10/17/13 0550 10/17/13 0551 10/18/13 0646  NA 140 142 140  --  140 140  K 4.2 3.8 3.1*  --  2.8* 3.9  CL 100 102 100  --  102 103  CO2 24 26 28   --  27 26  GLUCOSE 120* 115* 94  --  92 99  BUN 7 8 3*  --  <3* <3*  CREATININE 0.58 0.59 0.52  --  0.49* 0.56  CALCIUM 9.2 8.0* 7.6*  --  7.5* 8.2*  MG  --   --   --  1.6  --   --    Liver Function Tests:  Recent Labs Lab 10/15/13 0420 10/15/13 1126 10/16/13 0447 10/17/13 0551 10/18/13 0646  AST >10000* 7738* 2023* 391* 153*  ALT 4266* 3894* 2054* 1177* 810*  ALKPHOS 64 62 41 46 48  BILITOT 0.7 0.7 1.0 0.6 0.4  PROT 6.5 6.4 5.3* 5.3* 5.3*  ALBUMIN 3.3* 3.2* 2.6* 2.6* 2.6*    Recent Labs Lab 10/14/13 1450  LIPASE 17   No results found for this basename: AMMONIA,  in the last 168 hours CBC:  Recent Labs Lab 10/12/13 1521 10/14/13 1450 10/15/13 0420 10/18/13 0646  WBC 5.0 8.9 9.8 5.1  NEUTROABS 2.9 7.8*  --   --   HGB 15.0 16.4* 14.5 11.8*  HCT 44.8 47.7* 43.0 35.5*  MCV 102.8* 101.5* 102.9* 104.7*  PLT 190 160 125* 126*   Cardiac Enzymes: No results found for this basename: CKTOTAL, CKMB, CKMBINDEX, TROPONINI,  in the last 168 hours BNP: No components found with this basename: POCBNP,  CBG: No results found for this basename: GLUCAP,  in the last 168 hours  Time coordinating discharge:  Greater than 30  minutes  Signed:  Catarina Hartshornavid Gavriel Holzhauer, DO [facility] Pager: [phone] 10/18/2013, 11:26 AM

## 2013-10-18 NOTE — Progress Notes (Signed)
Pt is to be discharged home today. Pt is in NAD, IV is out, all paperwork has been reviewed/discussed with patient, and there are no questions/concerns at this time. Assessment is unchanged from this morning. Pt is to be accompanied downstairs by staff and family via wheelchair.  

## 2013-10-19 LAB — CERULOPLASMIN: CERULOPLASMIN: 23 mg/dL (ref 18–53)

## 2013-10-19 NOTE — Progress Notes (Signed)
UR chart review completed.  

## 2013-10-22 ENCOUNTER — Telehealth: Payer: Self-pay | Admitting: Gastroenterology

## 2013-10-22 NOTE — Telephone Encounter (Signed)
Pt has an appointment on May 21 @ 330 with [facility].

## 2013-10-22 NOTE — Telephone Encounter (Signed)
Patient needs E30, hospital follow up for acute hepatitis, in 3 weeks.  Needs LFTs next week.

## 2013-10-26 ENCOUNTER — Other Ambulatory Visit: Payer: Self-pay | Admitting: Gastroenterology

## 2013-10-26 ENCOUNTER — Other Ambulatory Visit: Payer: Self-pay

## 2013-10-26 DIAGNOSIS — K759 Inflammatory liver disease, unspecified: Secondary | ICD-10-CM

## 2013-10-26 NOTE — Telephone Encounter (Signed)
Letter and lab order mailed to the pt. 

## 2013-11-19 ENCOUNTER — Ambulatory Visit: Payer: Self-pay | Admitting: Gastroenterology

## 2013-11-19 ENCOUNTER — Encounter: Payer: Self-pay | Admitting: Gastroenterology

## 2013-11-19 ENCOUNTER — Telehealth: Payer: Self-pay | Admitting: Gastroenterology

## 2013-11-19 NOTE — Telephone Encounter (Signed)
Mailed letter °

## 2013-11-19 NOTE — Telephone Encounter (Signed)
Let's try to make contact with this patient. It is important that she is seen in follow-up with us and has repeat LFTs.

## 2013-11-19 NOTE — Telephone Encounter (Signed)
Pt was a no show

## 2013-11-20 NOTE — Telephone Encounter (Signed)
Mailed letter and copy of lab orders.

## 2013-12-14 ENCOUNTER — Emergency Department (HOSPITAL_COMMUNITY)
Admission: EM | Admit: 2013-12-14 | Discharge: 2013-12-14 | Disposition: A | Discharge status: Home / Self Care | Payer: MEDICAID | Attending: Emergency Medicine | Admitting: Emergency Medicine

## 2013-12-14 ENCOUNTER — Encounter (HOSPITAL_COMMUNITY): Payer: Self-pay | Admitting: Emergency Medicine

## 2013-12-14 DIAGNOSIS — I10 Essential (primary) hypertension: Secondary | ICD-10-CM | POA: Insufficient documentation

## 2013-12-14 DIAGNOSIS — L255 Unspecified contact dermatitis due to plants, except food: Secondary | ICD-10-CM | POA: Insufficient documentation

## 2013-12-14 DIAGNOSIS — Z79899 Other long term (current) drug therapy: Secondary | ICD-10-CM | POA: Insufficient documentation

## 2013-12-14 DIAGNOSIS — G8929 Other chronic pain: Secondary | ICD-10-CM | POA: Insufficient documentation

## 2013-12-14 DIAGNOSIS — F17200 Nicotine dependence, unspecified, uncomplicated: Secondary | ICD-10-CM | POA: Insufficient documentation

## 2013-12-14 MED ORDER — TRIAMCINOLONE ACETONIDE 0.1 % EX CREA: 1.0000 "application " | TOPICAL_CREAM | Freq: Three times a day (TID) | CUTANEOUS | Status: AC

## 2013-12-14 MED ORDER — DEXAMETHASONE SODIUM PHOSPHATE 4 MG/ML IJ SOLN
10.0000 mg | Freq: Once | INTRAMUSCULAR | Status: AC
  Administered 2013-12-14: 10 mg via INTRAMUSCULAR
  Filled 2013-12-14: qty 3

## 2013-12-14 MED ORDER — DIPHENHYDRAMINE HCL 25 MG PO CAPS
25.0000 mg | ORAL_CAPSULE | Freq: Once | ORAL | Status: AC
  Administered 2013-12-14: 25 mg via ORAL
  Filled 2013-12-14: qty 1

## 2013-12-14 NOTE — Discharge Instructions (Signed)
Contact Dermatitis Contact dermatitis is a rash that happens when something touches the skin. You touched something that irritates your skin, or you have allergies to something you touched. HOME CARE   Avoid the thing that caused your rash.  Keep your rash away from hot water, soap, sunlight, chemicals, and other things that might bother it.  Do not scratch your rash.  You can take cool baths to help stop itching.  Only take medicine as told by your doctor.  Keep all doctor visits as told. GET HELP RIGHT AWAY IF:   Your rash is not better after 3 days.  Your rash gets worse.  Your rash is puffy (swollen), tender, red, sore, or warm.  You have problems with your medicine. MAKE SURE YOU:   Understand these instructions.  Will watch your condition.  Will get help right away if you are not doing well or get worse. Document Released: 04/15/2009 Document Revised: 09/10/2011 Document Reviewed: 11/21/2010 [employer] Patient Information 2014 [employer].  

## 2013-12-14 NOTE — ED Provider Notes (Signed)
CSN: [id number]     Arrival date & time 12/14/13  1419 History  This chart was scribed for non-physician practitioner, Maxwell Caulammy L Anetra Czerwinski, PA-C working with Benny LennertJoseph L Zammit, MD by Luisa DagoPriscilla Tutu, ED scribe. This patient was seen in room APFT20/APFT20 and the patient's care was started at [facility]3:24 PM.    Chief Complaint  Patient presents with  . Rash   Patient is a 48 y.o. female presenting with rash. The history is provided by the patient. No language interpreter was used.  Rash Location:  Shoulder/arm Shoulder/arm rash location:  R forearm Quality: blistering, itchiness and redness   Severity:  Moderate Onset quality:  Gradual Duration:  4 days Progression:  Unchanged Chronicity:  New Context: insect bite/sting (tick)   Relieved by:  Nothing Worsened by:  Contact Associated symptoms: no abdominal pain, no diarrhea, no fever, no nausea, no periorbital edema, no shortness of breath, no sore throat, no throat swelling, no tongue swelling and not vomiting    HPI Comments: Erika Rodriguez is a 48 y.o. female who presents to the Emergency Department complaining of a rash that she first noticed 4 days ago. Pt states that the rash has since then been spreading. She is complaining of associated intermittent itching. The rash is located to her right forearm arm. She states that she pulled off a tick from that are and later cleaned the area with rubbing alcohol. She states she has not used neosporin to the area.  She denies fever, other rash, joint pains, headaches or myalgias.   Pt does not take any daily medications. She denies any other pertinent medical history.     Past Medical History  Diagnosis Date  . Chronic back pain   . Hypertension    Past Surgical History  Procedure Laterality Date  . Abdominal hysterectomy    . Cholecystectomy    . Esophagogastroduodenoscopy N/A 11/05/2012    WUJ:[id number]:Hiatal hernia. Subtle mucosal abdomen obese of the esophagus, stomach and duodenum-of uncertain  significance-s/p bx   Family History  Problem Relation Age of Onset  . Ulcers Neg Hx   . Liver disease Neg Hx   . Colon cancer Neg Hx   . Crohn's disease Neg Hx   . Ulcerative colitis Neg Hx   . Diabetes Mother   . Heart failure Mother   . Stroke Sister   . Epilepsy Sister    History  Substance Use Topics  . Smoking status: Current Every Day Smoker -- 0.50 packs/day for 20 years    Types: Cigarettes  . Smokeless tobacco: Never Used  . Alcohol Use: Yes     Comment: drink friday or saturday on occasion. just one beer.   OB History   Grav Para Term Preterm Abortions TAB SAB Ect Mult Living   1    1  1    0     Review of Systems  Constitutional: Negative for fever.  HENT: Negative for sore throat.   Respiratory: Negative for shortness of breath.   Gastrointestinal: Negative for nausea, vomiting, abdominal pain and diarrhea.  Skin: Positive for rash.  All other systems reviewed and are negative.     Allergies  Review of patient's allergies indicates no known allergies.  Home Medications   Prior to Admission medications   Medication Sig Start Date End Date Taking? Authorizing Provider  amitriptyline (ELAVIL) 25 MG tablet Take 1 tablet (25 mg total) by mouth at bedtime. 10/18/13   Catarina Hartshornavid Tat, MD  ondansetron (ZOFRAN ODT) 4 MG disintegrating  tablet Take 1 tablet (4 mg total) by mouth every 8 (eight) hours as needed for nausea. 10/12/13   Rolland PorterMark James, MD   Triage Vitals: BP 185/88  Pulse 81  Temp(Src) 98.2 F (36.8 C) (Oral)  Resp 20  Ht 5\' 4"  (1.626 m)  Wt 215 lb (97.523 kg)  BMI 36.89 kg/m2  SpO2 98%  Physical Exam  Nursing note and vitals reviewed. Constitutional: She is oriented to person, place, and time. She appears well-developed and well-nourished. No distress.  HENT:  Head: Normocephalic and atraumatic.  Eyes: Conjunctivae and EOM are normal.  Neck: Normal range of motion. Neck supple. No tracheal deviation present.  Cardiovascular: Normal rate, regular  rhythm, normal heart sounds and intact distal pulses.   Pulmonary/Chest: Effort normal. No respiratory distress.  Musculoskeletal: Normal range of motion.  Neurological: She is alert and oriented to person, place, and time.  Skin: Skin is warm and dry. Rash noted.  Localized vesicular appearing rash to the right mid forearm. Some vesicles in a linear pattern. Slight soft tissue swelling. Radial pulses are brisk. FROM of the right arm. Distal sensation intact.  Compartments of the arm are soft.  No remaining tick parts seen on exam.  Psychiatric: She has a normal mood and affect. Her behavior is normal.    ED Course  Procedures (including critical care time)  DIAGNOSTIC STUDIES: Oxygen Saturation is 98% on RA, normal by my interpretation.    COORDINATION OF CARE: 3:28 PM- Pt advised of plan for treatment and pt agrees. Advised pt to apply cold compresses to the area. Pt is declining the by mouth prescription of prednisone. She is requesting an injection instead. Discussed the possibility of incomplete resolution of sx's with single IM dose of steroid. Pt verbalized understanding and continues to decline oral steroid but agrees to use steroid cream.  I have advised her to return if sx's are not resolving.  She agrees to plan and appears stable for d/c  Labs Review Labs Reviewed - No data to display  Imaging Review No results found.   EKG Interpretation None      MDM   Final diagnoses:  Plant dermatitis   Pt with localized rash to right forearm that appears c/w plant dermatitis.  IM decadron given and rx for triamcinolone cream.  Pt agrees to also take OTC benadryl for itching. Rash does not appears c/w with tick related rash and pt is w/o associated sx's    I personally performed the services described in this documentation, which was scribed in my presence. The recorded information has been reviewed and is accurate.    Erika Cutbirth L. Sherece Gambrill, PA-C 12/16/13 2313

## 2013-12-14 NOTE — ED Notes (Signed)
Rash to right lower arm first noticed last Thursday and has spread since. C/o itching

## 2013-12-14 NOTE — ED Notes (Signed)
Itching red rash to rt forearm.

## 2013-12-18 NOTE — ED Provider Notes (Signed)
Medical screening examination/treatment/procedure(s) were performed by non-physician practitioner and as supervising physician I was immediately available for consultation/collaboration.   EKG Interpretation None        Maryn Freelove L Shenekia Riess, MD 12/18/13 1546 

## 2014-04-16 ENCOUNTER — Other Ambulatory Visit: Payer: Self-pay

## 2014-05-03 ENCOUNTER — Encounter (HOSPITAL_COMMUNITY): Payer: Self-pay | Admitting: Emergency Medicine

## 2014-05-17 ENCOUNTER — Emergency Department (HOSPITAL_COMMUNITY)
Admission: EM | Admit: 2014-05-17 | Discharge: 2014-05-17 | Disposition: A | Discharge status: Home / Self Care | Payer: Self-pay | Attending: Emergency Medicine | Admitting: Emergency Medicine

## 2014-05-17 ENCOUNTER — Encounter (HOSPITAL_COMMUNITY): Payer: Self-pay | Admitting: *Deleted

## 2014-05-17 ENCOUNTER — Emergency Department (HOSPITAL_COMMUNITY): Payer: Self-pay

## 2014-05-17 DIAGNOSIS — R1011 Right upper quadrant pain: Secondary | ICD-10-CM | POA: Insufficient documentation

## 2014-05-17 DIAGNOSIS — G8929 Other chronic pain: Secondary | ICD-10-CM | POA: Insufficient documentation

## 2014-05-17 DIAGNOSIS — Z9049 Acquired absence of other specified parts of digestive tract: Secondary | ICD-10-CM | POA: Insufficient documentation

## 2014-05-17 DIAGNOSIS — R109 Unspecified abdominal pain: Secondary | ICD-10-CM

## 2014-05-17 DIAGNOSIS — Z90710 Acquired absence of both cervix and uterus: Secondary | ICD-10-CM | POA: Insufficient documentation

## 2014-05-17 DIAGNOSIS — I10 Essential (primary) hypertension: Secondary | ICD-10-CM | POA: Insufficient documentation

## 2014-05-17 DIAGNOSIS — Z720 Tobacco use: Secondary | ICD-10-CM | POA: Insufficient documentation

## 2014-05-17 DIAGNOSIS — Z9889 Other specified postprocedural states: Secondary | ICD-10-CM | POA: Insufficient documentation

## 2014-05-17 DIAGNOSIS — Z90721 Acquired absence of ovaries, unilateral: Secondary | ICD-10-CM | POA: Insufficient documentation

## 2014-05-17 DIAGNOSIS — R1031 Right lower quadrant pain: Secondary | ICD-10-CM | POA: Insufficient documentation

## 2014-05-17 LAB — COMPREHENSIVE METABOLIC PANEL
ALK PHOS: 67 U/L (ref 39–117)
ALT: 19 U/L (ref 0–35)
AST: 21 U/L (ref 0–37)
Albumin: 3.8 g/dL (ref 3.5–5.2)
Anion gap: 12 (ref 5–15)
BUN: 6 mg/dL (ref 6–23)
CALCIUM: 9.9 mg/dL (ref 8.4–10.5)
CHLORIDE: 98 meq/L (ref 96–112)
CO2: 29 meq/L (ref 19–32)
Creatinine, Ser: 0.65 mg/dL (ref 0.50–1.10)
GFR calc Af Amer: >90 mL/min (ref >90)
Glucose, Bld: 109 mg/dL — ABNORMAL HIGH (ref 70–99)
POTASSIUM: 4.4 meq/L (ref 3.7–5.3)
SODIUM: 139 meq/L (ref 137–147)
Total Bilirubin: 0.6 mg/dL (ref 0.3–1.2)
Total Protein: 7.2 g/dL (ref 6.0–8.3)

## 2014-05-17 LAB — URINALYSIS, ROUTINE W REFLEX MICROSCOPIC
Bilirubin Urine: NEGATIVE
Glucose, UA: NEGATIVE mg/dL
Hgb urine dipstick: NEGATIVE
Ketones, ur: NEGATIVE mg/dL
Leukocytes, UA: NEGATIVE
Nitrite: NEGATIVE
PH: 6.5 (ref 5.0–8.0)
Protein, ur: NEGATIVE mg/dL
Specific Gravity, Urine: <1.005 — ABNORMAL LOW (ref 1.005–1.030)
Urobilinogen, UA: 0.2 mg/dL (ref 0.0–1.0)

## 2014-05-17 LAB — LIPASE, BLOOD: Lipase: 19 U/L (ref 11–59)

## 2014-05-17 LAB — CBC WITH DIFFERENTIAL/PLATELET
Basophils Absolute: 0 10*3/uL (ref 0.0–0.1)
Basophils Relative: 0 % (ref 0–1)
EOS PCT: 0 % (ref 0–5)
Eosinophils Absolute: 0 10*3/uL (ref 0.0–0.7)
HCT: 45.5 % (ref 36.0–46.0)
Hemoglobin: 16 g/dL — ABNORMAL HIGH (ref 12.0–15.0)
LYMPHS ABS: 1.5 10*3/uL (ref 0.7–4.0)
Lymphocytes Relative: 22 % (ref 12–46)
MCH: 34.5 pg — AB (ref 26.0–34.0)
MCHC: 35.2 g/dL (ref 30.0–36.0)
MCV: 98.1 fL (ref 78.0–100.0)
Monocytes Absolute: 0.3 10*3/uL (ref 0.1–1.0)
Monocytes Relative: 4 % (ref 3–12)
Neutro Abs: 4.8 10*3/uL (ref 1.7–7.7)
Neutrophils Relative %: 74 % (ref 43–77)
Platelets: 205 10*3/uL (ref 150–400)
RBC: 4.64 MIL/uL (ref 3.87–5.11)
RDW: 12.5 % (ref 11.5–15.5)
WBC: 6.6 10*3/uL (ref 4.0–10.5)

## 2014-05-17 MED ORDER — SODIUM CHLORIDE 0.9 % IV BOLUS (SEPSIS)
1000.0000 mL | Freq: Once | INTRAVENOUS | Status: AC
  Administered 2014-05-17: 1000 mL via INTRAVENOUS

## 2014-05-17 MED ORDER — KETOROLAC TROMETHAMINE 30 MG/ML IJ SOLN
30.0000 mg | Freq: Once | INTRAMUSCULAR | Status: AC
  Administered 2014-05-17: 30 mg via INTRAVENOUS
  Filled 2014-05-17: qty 1

## 2014-05-17 MED ORDER — TRAMADOL HCL 50 MG PO TABS: 50.0000 mg | ORAL_TABLET | Freq: Four times a day (QID) | ORAL | Status: AC | PRN

## 2014-05-17 MED ORDER — MORPHINE SULFATE 4 MG/ML IJ SOLN
4.0000 mg | Freq: Once | INTRAMUSCULAR | Status: AC
  Administered 2014-05-17: 4 mg via INTRAVENOUS
  Filled 2014-05-17: qty 1

## 2014-05-17 MED ORDER — ONDANSETRON HCL 4 MG/2ML IJ SOLN
4.0000 mg | Freq: Once | INTRAMUSCULAR | Status: AC
  Administered 2014-05-17: 4 mg via INTRAVENOUS
  Filled 2014-05-17: qty 2

## 2014-05-17 NOTE — Care Management Note (Signed)
ED/CM noted patient did not have health insurance and/or PCP listed in the computer.  Patient was given the [facility] resource handout with information on the clinics, food pantries, and the handout for new health insurance sign-up. Pt was also given a Rx assistance card. Pt was complaining that she was not being treated well, that she pulled IV out by accident, going to bathroom, and was being treated as if she did it on purpose. She states  Someone came and cleaned the mess up, and there is no blood on floor nor on patient, but that she could have part of the IV in her arm!  Explained that the IV is not a needle, but a one-piece catheter, and unlikely to leave anything in her arm. But CM will get her nurse to look at it again. Message given to [place], RN as Hilbert CorriganLorrie is away from the desk, and she will check on pt. Pt advocate also sent to room by RN.

## 2014-05-17 NOTE — ED Notes (Signed)
Called to patient's room with patient stating "I accidentally pulled out my IV when I went to the bathroom." Blood drops noted on floor from bed to bathroom. Advised Dr Judd Lienelo.

## 2014-05-17 NOTE — Discharge Instructions (Signed)
Tramadol as prescribed as needed for pain.  Follow-up with a primary care physician to discuss referrals or further workup.   Flank Pain Flank pain refers to pain that is located on the side of the body between the upper abdomen and the back. The pain may occur over a short period of time (acute) or may be long-term or reoccurring (chronic). It may be mild or severe. Flank pain can be caused by many things. CAUSES  Some of the more common causes of flank pain include:  Muscle strains.   Muscle spasms.   A disease of your spine (vertebral disk disease).   A lung infection (pneumonia).   Fluid around your lungs (pulmonary edema).   A kidney infection.   Kidney stones.   A very painful skin rash caused by the chickenpox virus (shingles).   Gallbladder disease.  HOME CARE INSTRUCTIONS  Home care will depend on the cause of your pain. In general,  Rest as directed by your caregiver.  Drink enough fluids to keep your urine clear or pale yellow.  Only take over-the-counter or prescription medicines as directed by your caregiver. Some medicines may help relieve the pain.  Tell your caregiver about any changes in your pain.  Follow up with your caregiver as directed. SEEK IMMEDIATE MEDICAL CARE IF:   Your pain is not controlled with medicine.   You have new or worsening symptoms.  Your pain increases.   You have abdominal pain.   You have shortness of breath.   You have persistent nausea or vomiting.   You have swelling in your abdomen.   You feel faint or pass out.   You have blood in your urine.  You have a fever or persistent symptoms for more than 2-3 days.  You have a fever and your symptoms suddenly get worse. MAKE SURE YOU:   Understand these instructions.  Will watch your condition.  Will get help right away if you are not doing well or get worse. Document Released: 08/09/2005 Document Revised: 03/12/2012 Document Reviewed:  01/31/2012 [facility] Patient Information 2015 [employer]. This information is not intended to replace advice given to you by your health care provider. Make sure you discuss any questions you have with your health care provider.

## 2014-05-17 NOTE — ED Notes (Signed)
Pt sates she is having right back pain that moves into her lower abdomen. Pain has been present for 2 weeks. Pt began vomiting yesterday, x7 times since then with constant dry heaves. Denies any pain with urination. Pt has last BM on Saturday, per her norm.

## 2014-05-17 NOTE — ED Notes (Signed)
Attempted to go over discharge instructions with patient with information to follow up with a primary doctor as directed by ED physician. Patient interrupted instructions and stated "you're just wasting time with you and me so you can just stop talking." Obtained signature from patient and discharged to waiting room to wait for ride home.

## 2014-05-17 NOTE — ED Provider Notes (Signed)
CSN: [id number]     Arrival date & time 05/17/14  1030 History  This chart was scribed for Erika Lyonsouglas Newell Wafer, MD by Abel PrestoKara Demonbreun, ED Scribe. This patient was seen in room APA08/APA08 and the patient's care was started at 12:06 PM.    Chief Complaint  Patient presents with  . Flank Pain     The history is provided by the patient. No language interpreter was used.    HPI Comments: Erika Rodriguez is a 48 y.o. female who presents to the Emergency Department complaining of right flank pain with onset two weeks ago. She notes symptoms have worsened since yesterday including vomiting. She has been unable to eat in the last 2 days  She notes also difficulty urinating.  She has been taking Advil for relief. She has presented to ED and has been admitted on two other occasions in the last year for similar symptoms without diagnosis. Pt has past surgical history of hysterectomy, partial oophorectomy, and a cholecystectomy.    Past Medical History  Diagnosis Date  . Chronic back pain   . Hypertension    Past Surgical History  Procedure Laterality Date  . Abdominal hysterectomy    . Cholecystectomy    . Esophagogastroduodenoscopy N/A 11/05/2012    NWG:[id number]:Hiatal hernia. Subtle mucosal abdomen obese of the esophagus, stomach and duodenum-of uncertain significance-s/p bx   Family History  Problem Relation Age of Onset  . Ulcers Neg Hx   . Liver disease Neg Hx   . Colon cancer Neg Hx   . Crohn's disease Neg Hx   . Ulcerative colitis Neg Hx   . Diabetes Mother   . Heart failure Mother   . Stroke Sister   . Epilepsy Sister    History  Substance Use Topics  . Smoking status: Current Every Day Smoker -- 0.50 packs/day for 20 years    Types: Cigarettes  . Smokeless tobacco: Never Used  . Alcohol Use: Yes     Comment: drink friday or saturday on occasion. just one beer.   OB History    Gravida Para Term Preterm AB TAB SAB Ectopic Multiple Living   1    1  1    0     Review of Systems A complete 10  system review of systems was obtained and all systems are negative except as noted in the HPI and PMH.     Allergies  Review of patient's allergies indicates no known allergies.  Home Medications   Prior to Admission medications   Medication Sig Start Date End Date Taking? Authorizing Provider  ibuprofen (ADVIL,MOTRIN) 200 MG tablet Take 400 mg by mouth 4 (four) times daily.   Yes Historical Provider, MD  Naproxen Sod-Diphenhydramine (ALEVE PM PO) Take 3 tablets by mouth at bedtime.   Yes Historical Provider, MD  triamcinolone cream (KENALOG) 0.1 % Apply 1 application topically 3 (three) times daily. Patient not taking: Reported on 05/17/2014 12/14/13   Tammy L. Triplett, PA-C   BP 159/100 mmHg  Pulse 97  Temp(Src) 98 F (36.7 C) (Oral)  Resp 16  Ht 5\' 4"  (1.626 m)  Wt 198 lb (89.812 kg)  BMI 33.97 kg/m2  SpO2 98% Physical Exam  Constitutional: She is oriented to person, place, and time. She appears well-developed and well-nourished.  HENT:  Head: Normocephalic.  Eyes: Conjunctivae are normal.  Neck: Normal range of motion. Neck supple.  Cardiovascular: Normal rate, regular rhythm and normal heart sounds.   Pulmonary/Chest: Effort normal.  Abdominal: Soft. Bowel sounds  are normal. She exhibits no distension. There is tenderness.  TTP in RUQ and RLQ   Musculoskeletal: Normal range of motion.  Neurological: She is alert and oriented to person, place, and time.  Skin: Skin is warm and dry.  Psychiatric: She has a normal mood and affect. Her behavior is normal.  Nursing note and vitals reviewed.   ED Course  Procedures (including critical care time) DIAGNOSTIC STUDIES: Oxygen Saturation is 98% on room air, normal by my interpretation.    COORDINATION OF CARE: 12:11 PM Discussed treatment plan with patient at beside, the patient agrees with the plan and has no further questions at this time.   Labs Review Labs Reviewed - No data to display  Imaging Review No results  found.   EKG Interpretation None      MDM   Final diagnoses:  None    Patient presents with complaints of a 2 year history of right flank pain. She has been hospitalized in the past with elevated LFTs. This was felt to be related to herbal supplements and Tylenol use. She has these episodes that flare up intermittently that have been thus far unexplained. Today's workup reveals normal LFTs, no elevation of white count, clear urinalysis, and CT with renal protocol that was negative for stone or other pathology.  I see nothing in the workup that is emergent and requiring hospitalization or specialty referral. She will be discharged to home with advice to follow-up with her primary doctor to arrange follow-up appointments and referrals to specialists.   I personally performed the services described in this documentation, which was scribed in my presence. The recorded information has been reviewed and is accurate.      Erika Lyonsouglas Tiffancy Moger, MD 05/17/14 [id number]

## 2014-08-09 ENCOUNTER — Emergency Department (HOSPITAL_COMMUNITY): Payer: Self-pay

## 2014-08-09 ENCOUNTER — Emergency Department (HOSPITAL_COMMUNITY)
Admission: EM | Admit: 2014-08-09 | Discharge: 2014-08-09 | Disposition: A | Discharge status: Home / Self Care | Payer: Self-pay | Attending: Emergency Medicine | Admitting: Emergency Medicine

## 2014-08-09 ENCOUNTER — Encounter (HOSPITAL_COMMUNITY): Payer: Self-pay | Admitting: Emergency Medicine

## 2014-08-09 DIAGNOSIS — H539 Unspecified visual disturbance: Secondary | ICD-10-CM | POA: Insufficient documentation

## 2014-08-09 DIAGNOSIS — R109 Unspecified abdominal pain: Secondary | ICD-10-CM

## 2014-08-09 DIAGNOSIS — Z720 Tobacco use: Secondary | ICD-10-CM | POA: Insufficient documentation

## 2014-08-09 DIAGNOSIS — I10 Essential (primary) hypertension: Secondary | ICD-10-CM | POA: Insufficient documentation

## 2014-08-09 DIAGNOSIS — G8929 Other chronic pain: Secondary | ICD-10-CM | POA: Insufficient documentation

## 2014-08-09 DIAGNOSIS — R509 Fever, unspecified: Secondary | ICD-10-CM | POA: Insufficient documentation

## 2014-08-09 DIAGNOSIS — Z79899 Other long term (current) drug therapy: Secondary | ICD-10-CM | POA: Insufficient documentation

## 2014-08-09 DIAGNOSIS — M549 Dorsalgia, unspecified: Secondary | ICD-10-CM | POA: Insufficient documentation

## 2014-08-09 DIAGNOSIS — R112 Nausea with vomiting, unspecified: Secondary | ICD-10-CM | POA: Insufficient documentation

## 2014-08-09 DIAGNOSIS — R1084 Generalized abdominal pain: Secondary | ICD-10-CM | POA: Insufficient documentation

## 2014-08-09 LAB — HEPATIC FUNCTION PANEL
ALBUMIN: 4.6 g/dL (ref 3.5–5.2)
ALT: 24 U/L (ref 0–35)
AST: 23 U/L (ref 0–37)
Alkaline Phosphatase: 54 U/L (ref 39–117)
BILIRUBIN DIRECT: 0.1 mg/dL (ref 0.0–0.5)
Indirect Bilirubin: 0.7 mg/dL (ref 0.3–0.9)
TOTAL PROTEIN: 8 g/dL (ref 6.0–8.3)
Total Bilirubin: 0.8 mg/dL (ref 0.3–1.2)

## 2014-08-09 LAB — URINALYSIS, ROUTINE W REFLEX MICROSCOPIC
Bilirubin Urine: NEGATIVE
GLUCOSE, UA: NEGATIVE mg/dL
Hgb urine dipstick: NEGATIVE
Ketones, ur: 15 mg/dL — AB
LEUKOCYTES UA: NEGATIVE
Nitrite: NEGATIVE
PH: 6 (ref 5.0–8.0)
Protein, ur: NEGATIVE mg/dL
UROBILINOGEN UA: 0.2 mg/dL (ref 0.0–1.0)

## 2014-08-09 LAB — BASIC METABOLIC PANEL
ANION GAP: 8 (ref 5–15)
BUN: 8 mg/dL (ref 6–23)
CHLORIDE: 103 mmol/L (ref 96–112)
CO2: 27 mmol/L (ref 19–32)
Calcium: 9.6 mg/dL (ref 8.4–10.5)
Creatinine, Ser: 0.61 mg/dL (ref 0.50–1.10)
GFR calc Af Amer: >90 mL/min (ref >90)
GLUCOSE: 110 mg/dL — AB (ref 70–99)
POTASSIUM: 4.3 mmol/L (ref 3.5–5.1)
SODIUM: 138 mmol/L (ref 135–145)

## 2014-08-09 LAB — CBC WITH DIFFERENTIAL/PLATELET
BASOS PCT: 0 % (ref 0–1)
Basophils Absolute: 0 10*3/uL (ref 0.0–0.1)
Eosinophils Absolute: 0 10*3/uL (ref 0.0–0.7)
Eosinophils Relative: 0 % (ref 0–5)
HEMATOCRIT: 49 % — AB (ref 36.0–46.0)
Hemoglobin: 17 g/dL — ABNORMAL HIGH (ref 12.0–15.0)
LYMPHS ABS: 1.9 10*3/uL (ref 0.7–4.0)
LYMPHS PCT: 25 % (ref 12–46)
MCH: 33.7 pg (ref 26.0–34.0)
MCHC: 34.7 g/dL (ref 30.0–36.0)
MCV: 97 fL (ref 78.0–100.0)
Monocytes Absolute: 0.4 10*3/uL (ref 0.1–1.0)
Monocytes Relative: 6 % (ref 3–12)
NEUTROS ABS: 5.1 10*3/uL (ref 1.7–7.7)
Neutrophils Relative %: 69 % (ref 43–77)
Platelets: 235 10*3/uL (ref 150–400)
RBC: 5.05 MIL/uL (ref 3.87–5.11)
RDW: 11.9 % (ref 11.5–15.5)
WBC: 7.4 10*3/uL (ref 4.0–10.5)

## 2014-08-09 LAB — LIPASE, BLOOD: Lipase: 19 U/L (ref 11–59)

## 2014-08-09 MED ORDER — HYDROCODONE-ACETAMINOPHEN 5-325 MG PO TABS: 1.0000 | ORAL_TABLET | Freq: Four times a day (QID) | ORAL | Status: AC | PRN

## 2014-08-09 MED ORDER — IOHEXOL 300 MG/ML  SOLN
100.0000 mL | Freq: Once | INTRAMUSCULAR | Status: AC | PRN
  Administered 2014-08-09: 100 mL via INTRAVENOUS

## 2014-08-09 MED ORDER — ONDANSETRON HCL 4 MG/2ML IJ SOLN
4.0000 mg | Freq: Once | INTRAMUSCULAR | Status: AC
  Administered 2014-08-09: 4 mg via INTRAVENOUS
  Filled 2014-08-09: qty 2

## 2014-08-09 MED ORDER — HYDROMORPHONE HCL 1 MG/ML IJ SOLN
1.0000 mg | Freq: Once | INTRAMUSCULAR | Status: AC
  Administered 2014-08-09: 1 mg via INTRAVENOUS
  Filled 2014-08-09: qty 1

## 2014-08-09 MED ORDER — IOHEXOL 300 MG/ML  SOLN
25.0000 mL | Freq: Once | INTRAMUSCULAR | Status: AC | PRN
  Administered 2014-08-09: 25 mL via ORAL

## 2014-08-09 MED ORDER — SODIUM CHLORIDE 0.9 % IJ SOLN
INTRAMUSCULAR | Status: AC
  Filled 2014-08-09: qty 500

## 2014-08-09 MED ORDER — SODIUM CHLORIDE 0.9 % IV BOLUS (SEPSIS)
1000.0000 mL | Freq: Once | INTRAVENOUS | Status: AC
  Administered 2014-08-09: 1000 mL via INTRAVENOUS

## 2014-08-09 MED ORDER — PROMETHAZINE HCL 25 MG PO TABS: 25.0000 mg | ORAL_TABLET | Freq: Four times a day (QID) | ORAL | Status: AC | PRN

## 2014-08-09 MED ORDER — SODIUM CHLORIDE 0.9 % IV SOLN: INTRAVENOUS | Status: DC

## 2014-08-09 NOTE — ED Notes (Signed)
Pt c/o abd pain/n/v since Friday night. denies diarrhea. lnbm saturday.

## 2014-08-09 NOTE — ED Provider Notes (Signed)
CSN: [id number]     Arrival date & time 08/09/14  1020 History  This chart was scribed for Vanetta MuldersScott Trygve Thal, MD by Ronney LionSuzanne Le, ED Scribe. This patient was seen in room APA04/[facility]04 and the patient's care was started at 12:27 PM.    Chief Complaint  Patient presents with  . Abdominal Pain   Patient is a 49 y.o. female presenting with abdominal pain. The history is provided by the patient. No language interpreter was used.  Abdominal Pain Pain location:  Generalized Pain radiates to:  Back Pain severity:  Severe Onset quality:  Gradual Duration:  4 days Timing:  Constant Progression:  Worsening Chronicity:  New Relieved by:  None tried Worsened by:  Nothing tried Ineffective treatments:  None tried Associated symptoms: chills, fever, nausea and vomiting   Associated symptoms: no chest pain, no cough, no diarrhea, no dysuria, no hematemesis, no shortness of breath and no sore throat     HPI Comments: [name] is a 49 y.o. female who presents to the Emergency Department complaining of 10/10 abdominal pain that radiates to her back, with onset 4 days ago, though it worsened last night. Patient also complains of associated nausea and vomiting that began 4 days ago, fever, chills, and blurred vision. She reports she has had similiar pain before but doesn't know the etiology. She denies diarrhea, hematemesis, cough, rhinorrhea, sore throat, chest pain, SOB, dysuria, leg swelling, rash, bleeding easily, or headache.   Past Medical History  Diagnosis Date  . Chronic back pain   . Hypertension    Past Surgical History  Procedure Laterality Date  . Abdominal hysterectomy    . Cholecystectomy    . Esophagogastroduodenoscopy N/A 11/05/2012    YQM:VHQIONRMR:Hiatal hernia. Subtle mucosal abdomen obese of the esophagus, stomach and duodenum-of uncertain significance-s/p bx   Family History  Problem Relation Age of Onset  . Ulcers Neg Hx   . Liver disease Neg Hx   . Colon cancer Neg Hx   . Crohn's  disease Neg Hx   . Ulcerative colitis Neg Hx   . Diabetes Mother   . Heart failure Mother   . Stroke Sister   . Epilepsy Sister    History  Substance Use Topics  . Smoking status: Current Every Day Smoker -- 0.50 packs/day for 20 years    Types: Cigarettes  . Smokeless tobacco: Never Used  . Alcohol Use: Yes     Comment: drink friday or saturday on occasion. just one beer.   OB History    Gravida Para Term Preterm AB TAB SAB Ectopic Multiple Living   1    1  1    0     Review of Systems  Constitutional: Positive for fever and chills.  HENT: Negative for rhinorrhea and sore throat.   Eyes: Positive for visual disturbance.  Respiratory: Negative for cough and shortness of breath.   Cardiovascular: Negative for chest pain and leg swelling.  Gastrointestinal: Positive for nausea, vomiting and abdominal pain. Negative for diarrhea and hematemesis.  Genitourinary: Negative for dysuria.  Musculoskeletal: Positive for back pain.  Skin: Negative for rash.  Neurological: Negative for headaches.  Hematological: Does not bruise/bleed easily.      Allergies  Review of patient's allergies indicates no known allergies.  Home Medications   Prior to Admission medications   Medication Sig Start Date End Date Taking? Authorizing Provider  ibuprofen (ADVIL,MOTRIN) 200 MG tablet Take 400 mg by mouth every 6 (six) hours as needed for moderate pain.  Yes Historical Provider, MD  Naproxen Sod-Diphenhydramine (ALEVE PM PO) Take 3 tablets by mouth at bedtime as needed (sleep).    Yes Historical Provider, MD  HYDROcodone-acetaminophen (NORCO/VICODIN) 5-325 MG per tablet Take 1-2 tablets by mouth every 6 (six) hours as needed. 08/09/14   Vanetta Mulders, MD  promethazine (PHENERGAN) 25 MG tablet Take 1 tablet (25 mg total) by mouth every 6 (six) hours as needed. 08/09/14   Vanetta Mulders, MD  traMADol (ULTRAM) 50 MG tablet Take 1 tablet (50 mg total) by mouth every 6 (six) hours as needed. Patient  not taking: Reported on 08/09/2014 05/17/14   Geoffery Lyons, MD  triamcinolone cream (KENALOG) 0.1 % Apply 1 application topically 3 (three) times daily. Patient not taking: Reported on 05/17/2014 12/14/13   Tammy L. Triplett, PA-C   BP 150/97 mmHg  Pulse 73  Temp(Src) 98.3 F (36.8 C)  Resp 18  Ht 5\' 4"  (1.626 m)  Wt 189 lb (85.73 kg)  BMI 32.43 kg/m2  SpO2 99% Physical Exam  Constitutional: She is oriented to person, place, and time. She appears well-developed and well-nourished. No distress.  HENT:  Head: Normocephalic and atraumatic.  Mucus membranes are a little dry.  Eyes: Conjunctivae and EOM are normal. Pupils are equal, round, and reactive to light. No scleral icterus.  Neck: Neck supple. No tracheal deviation present.  Cardiovascular: Normal rate, regular rhythm and normal heart sounds.   Pulmonary/Chest: Effort normal and breath sounds normal. No respiratory distress. She has no wheezes. She has no rales.  Lungs are clear to auscultation bilaterally.  Abdominal: Soft. Bowel sounds are normal. There is tenderness.  Generalized tenderness--tenderness is greatest at periumbilicus.  Musculoskeletal: Normal range of motion. She exhibits no edema.  Neurological: She is alert and oriented to person, place, and time.  Skin: Skin is warm and dry.  Psychiatric: She has a normal mood and affect. Her behavior is normal.  Nursing note and vitals reviewed.   ED Course  Procedures (including critical care time)  DIAGNOSTIC STUDIES: Oxygen Saturation is 99% on room air, normal by my interpretation.    COORDINATION OF CARE: 12:29 PM - Discussed treatment plan with pt at bedside which includes several tests, and pt agreed to plan.   Labs Review Labs Reviewed  URINALYSIS, ROUTINE W REFLEX MICROSCOPIC - Abnormal; Notable for the following:    Specific Gravity, Urine <1.005 (*)    Ketones, ur 15 (*)    All other components within normal limits  CBC WITH DIFFERENTIAL/PLATELET -  Abnormal; Notable for the following:    Hemoglobin 17.0 (*)    HCT 49.0 (*)    All other components within normal limits  BASIC METABOLIC PANEL - Abnormal; Notable for the following:    Glucose, Bld 110 (*)    All other components within normal limits  LIPASE, BLOOD  HEPATIC FUNCTION PANEL   Results for orders placed or performed during the hospital encounter of 08/09/14  Urinalysis, Routine w reflex microscopic  Result Value Ref Range   Color, Urine YELLOW YELLOW   APPearance CLEAR CLEAR   Specific Gravity, Urine <1.005 (L) 1.005 - 1.030   pH 6.0 5.0 - 8.0   Glucose, UA NEGATIVE NEGATIVE mg/dL   Hgb urine dipstick NEGATIVE NEGATIVE   Bilirubin Urine NEGATIVE NEGATIVE   Ketones, ur 15 (A) NEGATIVE mg/dL   Protein, ur NEGATIVE NEGATIVE mg/dL   Urobilinogen, UA 0.2 0.0 - 1.0 mg/dL   Nitrite NEGATIVE NEGATIVE   Leukocytes, UA NEGATIVE NEGATIVE  CBC  with Differential  Result Value Ref Range   WBC 7.4 4.0 - 10.5 K/uL   RBC 5.05 3.87 - 5.11 MIL/uL   Hemoglobin 17.0 (H) 12.0 - 15.0 g/dL   HCT 16.1 (H) 09.6 - 04.5 %   MCV 97.0 78.0 - 100.0 fL   MCH 33.7 26.0 - 34.0 pg   MCHC 34.7 30.0 - 36.0 g/dL   RDW 40.9 81.1 - 91.4 %   Platelets 235 150 - 400 K/uL   Neutrophils Relative % 69 43 - 77 %   Neutro Abs 5.1 1.7 - 7.7 K/uL   Lymphocytes Relative 25 12 - 46 %   Lymphs Abs 1.9 0.7 - 4.0 K/uL   Monocytes Relative 6 3 - 12 %   Monocytes Absolute 0.4 0.1 - 1.0 K/uL   Eosinophils Relative 0 0 - 5 %   Eosinophils Absolute 0.0 0.0 - 0.7 K/uL   Basophils Relative 0 0 - 1 %   Basophils Absolute 0.0 0.0 - 0.1 K/uL  Basic metabolic panel  Result Value Ref Range   Sodium 138 135 - 145 mmol/L   Potassium 4.3 3.5 - 5.1 mmol/L   Chloride 103 96 - 112 mmol/L   CO2 27 19 - 32 mmol/L   Glucose, Bld 110 (H) 70 - 99 mg/dL   BUN 8 6 - 23 mg/dL   Creatinine, Ser 7.82 0.50 - 1.10 mg/dL   Calcium 9.6 8.4 - 95.6 mg/dL   GFR calc non Af Amer >90 >90 mL/min   GFR calc Af Amer >90 >90 mL/min    Anion gap 8 5 - 15  Lipase, blood  Result Value Ref Range   Lipase 19 11 - 59 U/L  Hepatic function panel  Result Value Ref Range   Total Protein 8.0 6.0 - 8.3 g/dL   Albumin 4.6 3.5 - 5.2 g/dL   AST 23 0 - 37 U/L   ALT 24 0 - 35 U/L   Alkaline Phosphatase 54 39 - 117 U/L   Total Bilirubin 0.8 0.3 - 1.2 mg/dL   Bilirubin, Direct 0.1 0.0 - 0.5 mg/dL   Indirect Bilirubin 0.7 0.3 - 0.9 mg/dL     Imaging Review Ct Abdomen Pelvis W Contrast  08/09/2014   CLINICAL DATA:  Mid abdominal pain and nausea 4 days  EXAM: CT ABDOMEN AND PELVIS WITH CONTRAST  TECHNIQUE: Multidetector CT imaging of the abdomen and pelvis was performed using the standard protocol following bolus administration of intravenous contrast. Oral contrast was also administered.  CONTRAST:  25mL OMNIPAQUE IOHEXOL 300 MG/ML SOLN, OMNIPAQUE IOHEXOL 300 MG/ML SOLN  COMPARISON:  May 17, 2014  FINDINGS: There is minimal bibasilar posterior pleural thickening. There is no lung edema or consolidation in the bases.  No focal liver lesions are identified. Gallbladder is absent. Common bile duct measures 9 mm which is felt to be within normal limits given post cholecystectomy state. No biliary duct mass or calculus is seen.  Spleen, pancreas, and adrenals appear normal.  Kidneys bilaterally show no mass or hydronephrosis on either side. There is no renal or ureteral calculus on either side.  In the pelvis, the urinary bladder is midline. There is no pelvic mass or pelvic fluid collection. Appendix appears normal. Uterus is absent.  There is no appreciable bowel obstruction. No free air or portal venous air. There is no ascites, adenopathy, or abscess in the abdomen or pelvis. There is atherosclerotic change in the aorta but no evidence of aneurysm. There is degenerative change  in the lumbar spine. There is slight anterolisthesis of L4 on L5, stable. There is marked disc space narrowing at L4-5. There is stable anterior wedging of the T10  and T11 vertebral bodies. There are no apparent blastic or lytic bone lesions.  IMPRESSION: There is no bowel obstruction. No mesenteric inflammation or abscess. Appendix appears normal. No renal or ureteral calculus. No hydronephrosis.  Uterus and gallbladder absent. Common bile duct measures 9 mm, within normal limits for postcholecystectomy state.  Stable lower thoracic anterior wedge compression fractures. Mild spondylolisthesis at L4-5 is stable and felt to be due to underlying spondylosis.   Electronically Signed   By: Bretta Bang M.D.   On: 08/09/2014 13:40     EKG Interpretation None      MDM   Final diagnoses:  Abdominal pain   Patient improving in the emergency department. CT scan without any explanation for the abdominal pain. May be viral gastritis. Patient will be treated with pain medicine and antinausea medicine. Patient will return for any new or worse symptoms. Labs without any sniffing abnormalities. Lipase not elevated not consistent with pancreatitis. Liver function tests without significant abnormalities. Common bile duct normal for gallbladder is been removed.  No leukocytosis. Patient stable for discharge home.  I personally performed the services described in this documentation, which was scribed in my presence. The recorded information has been reviewed and is accurate.     Vanetta Mulders, MD 08/09/14 [phone]

## 2014-08-09 NOTE — Discharge Instructions (Signed)
Workup for the abdominal pain without any significant findings. May be viral related. Take pain medicine as needed. Take Phenergan as needed for the nausea and vomiting. Return for any new or worse symptoms. Would expect you to be improving over the next 24 hours.

## 2015-11-30 IMAGING — CT CT ABD-PELV W/ CM
2 of 5 series · 16 of 46 positions shown, 18 images · IV contrast (Omnipaque 300)
Comparison: May 17, 2014

CLINICAL DATA: Mid abdominal pain and nausea 4 days

EXAM:
CT ABDOMEN AND PELVIS WITH CONTRAST
TECHNIQUE: Multidetector CT imaging of the abdomen and pelvis was performed
using the standard protocol following bolus administration of
intravenous contrast. Oral contrast was also administered.
CONTRAST:  25mL OMNIPAQUE IOHEXOL 300 MG/ML SOLN, 100mL OMNIPAQUE
IOHEXOL 300 MG/ML SOLN

[Series 2: abd_pel_with 5.0 b40f · axial · 0.76mm/px · z∈[-436,-11]mm · 13 of 97 slices shown, 15 images]
[im 6/97  soft-tissue]
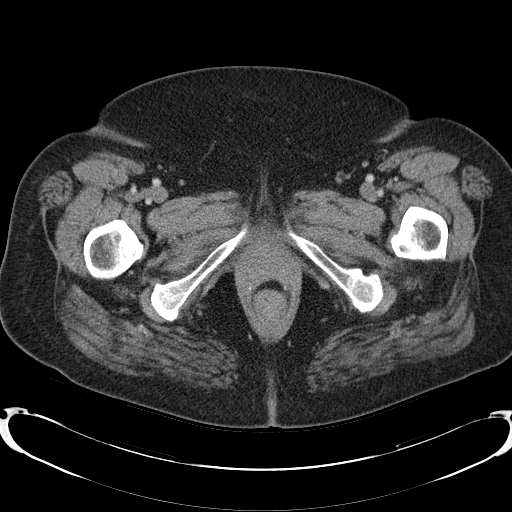
[im 6/97  bone]
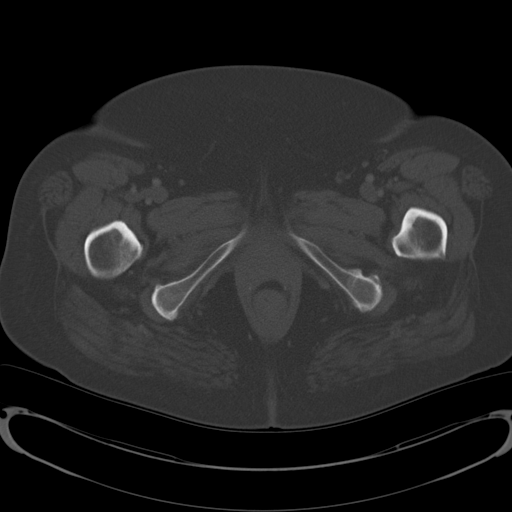
[im 12/97  soft-tissue]
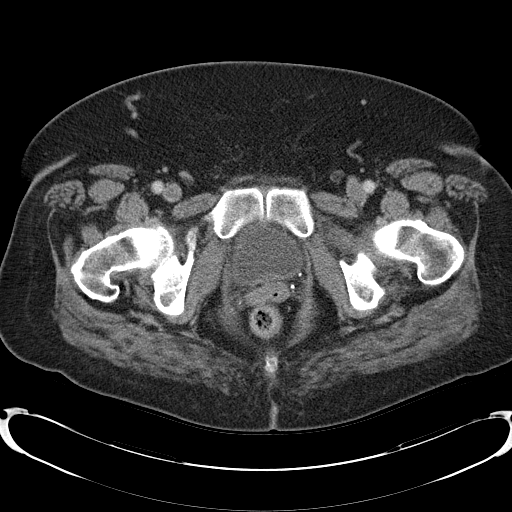
[im 23/97  soft-tissue]
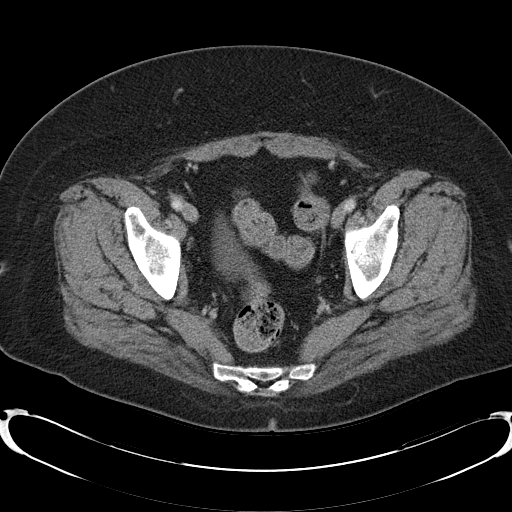
[im 29/97  soft-tissue]
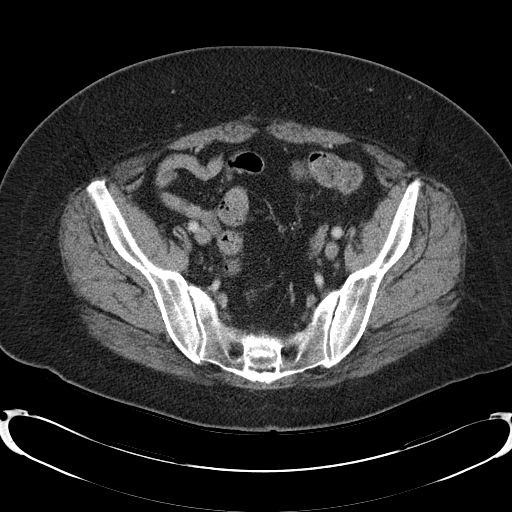
[im 34/97  soft-tissue]
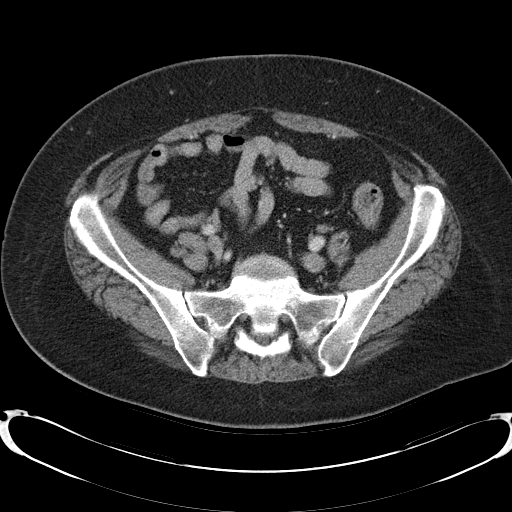
[im 40/97  soft-tissue]
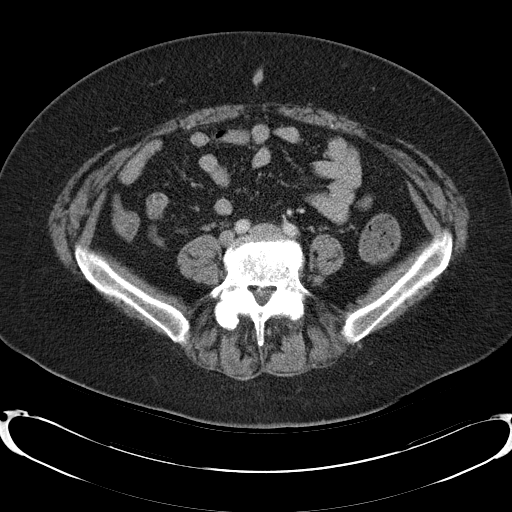
[im 51/97  soft-tissue]
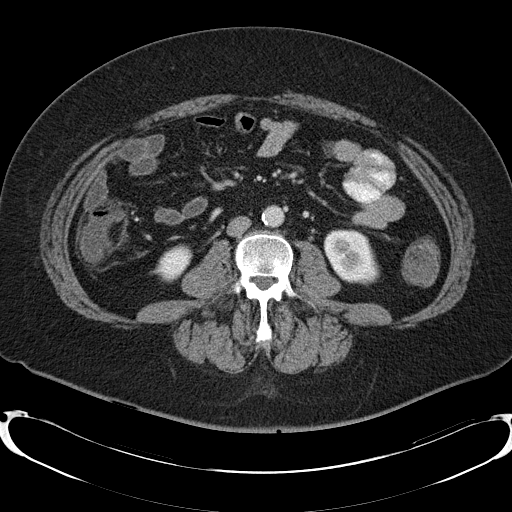
[im 57/97  soft-tissue]
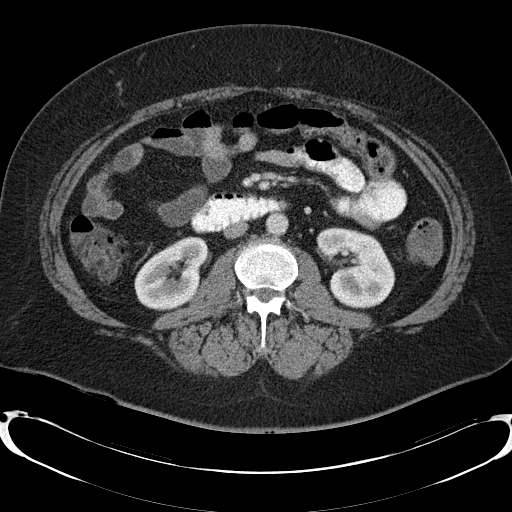
[im 63/97  soft-tissue]
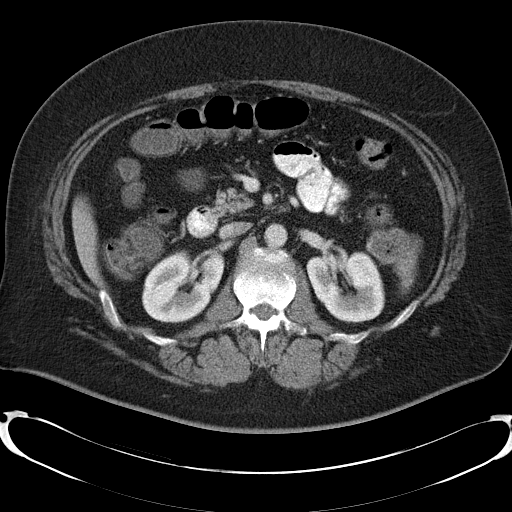
[im 63/97  bone]
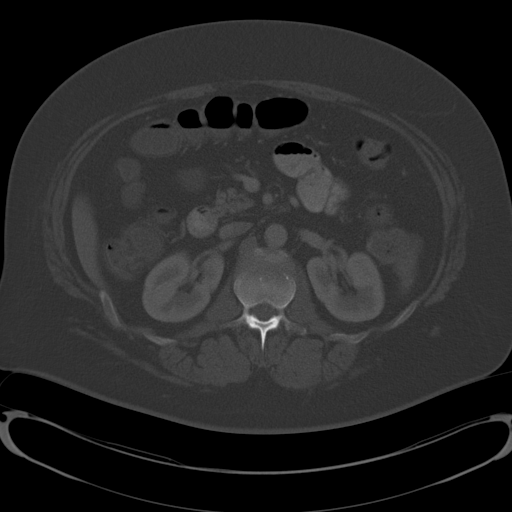
[im 68/97  soft-tissue]
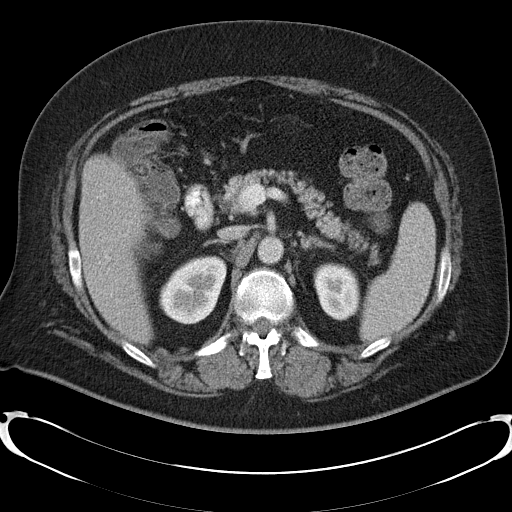
[im 74/97  soft-tissue]
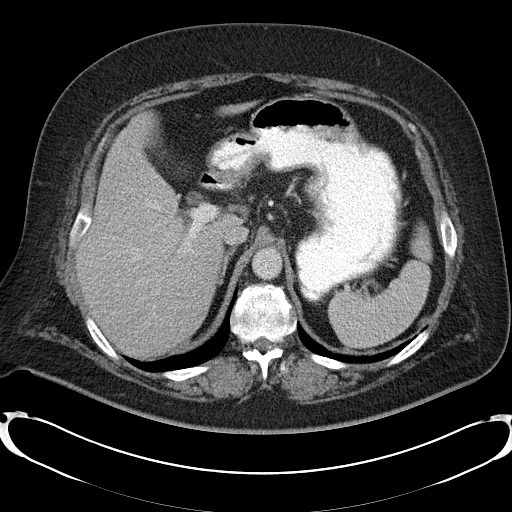
[im 85/97  soft-tissue]
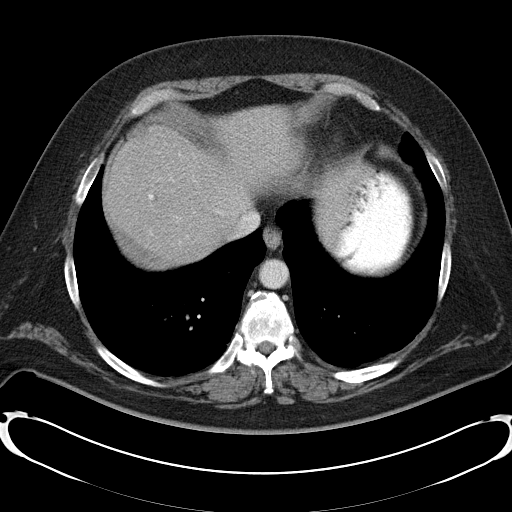
[im 91/97  soft-tissue]
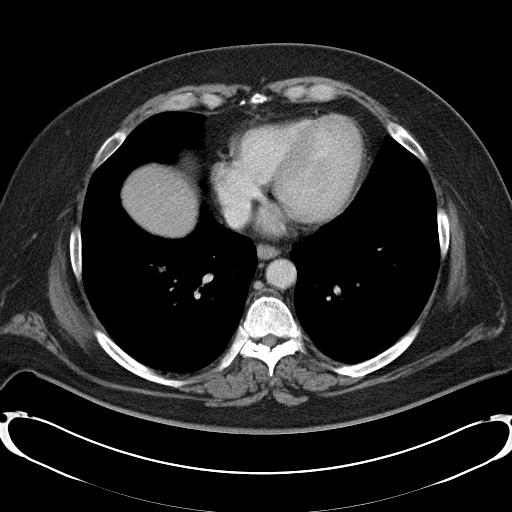

[Series 4: abd_pel_with 3.0 spo cor · coronal · 0.73mm/px · 3 of 102 slices shown]
[im 34/102  soft-tissue]
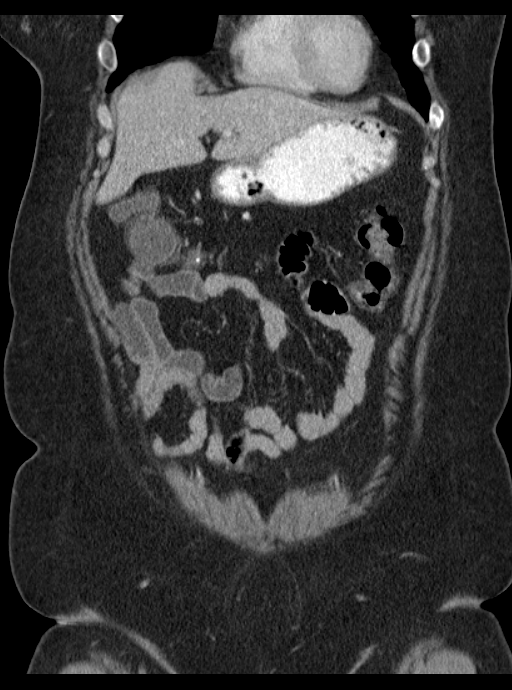
[im 45/102  soft-tissue]
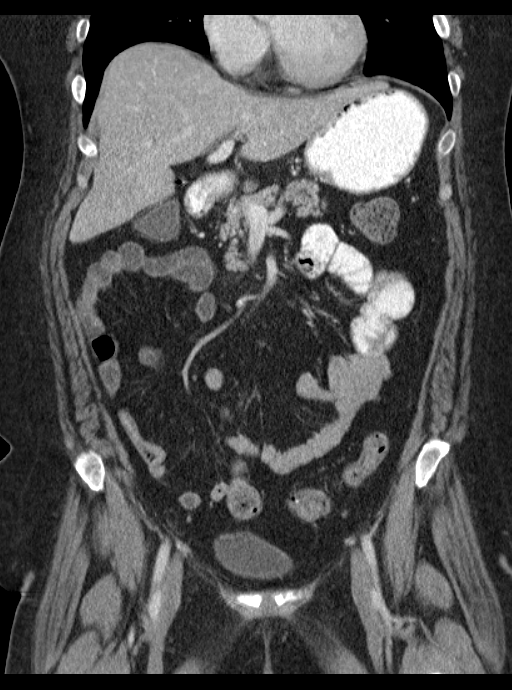
[im 57/102  soft-tissue]
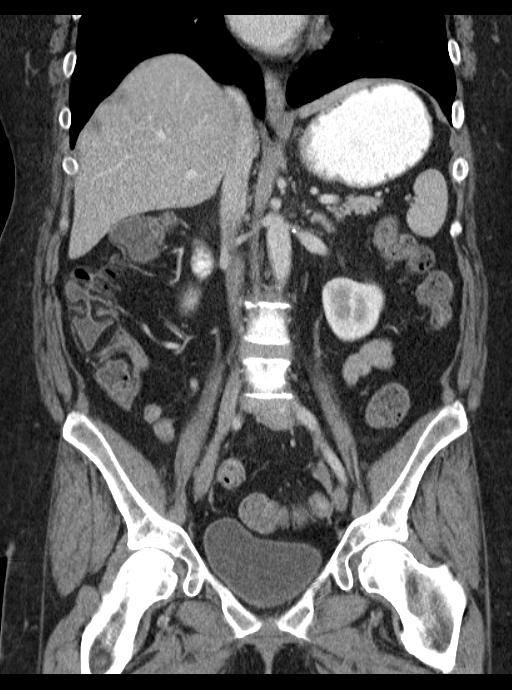

[16 of 46 positions shown; findings below may reference images not displayed]

FINDINGS: There is minimal bibasilar posterior pleural thickening. There is no
lung edema or consolidation in the bases.

No focal liver lesions are identified. Gallbladder is absent. Common
bile duct measures 9 mm which is felt to be within normal limits
given post cholecystectomy state. No biliary duct mass or calculus
is seen.

Spleen, pancreas, and adrenals appear normal.

Kidneys bilaterally show no mass or hydronephrosis on either side.
There is no renal or ureteral calculus on either side.

In the pelvis, the urinary bladder is midline. There is no pelvic
mass or pelvic fluid collection. Appendix appears normal. Uterus is
absent.

There is no appreciable bowel obstruction. No free air or portal
venous air. There is no ascites, adenopathy, or abscess in the
abdomen or pelvis. There is atherosclerotic change in the aorta but
no evidence of aneurysm. There is degenerative change in the lumbar
spine. There is slight anterolisthesis of L4 on L5, stable. There is
marked disc space narrowing at L4-5. There is stable anterior
wedging of the T10 and T11 vertebral bodies. There are no apparent
blastic or lytic bone lesions.
IMPRESSION: There is no bowel obstruction. No mesenteric inflammation or
abscess. Appendix appears normal. No renal or ureteral calculus. No
hydronephrosis.

Uterus and gallbladder absent. Common bile duct measures 9 mm,
within normal limits for postcholecystectomy state.

Stable lower thoracic anterior wedge compression fractures. Mild
spondylolisthesis at L4-5 is stable and felt to be due to underlying
spondylosis.

## 2023-10-08 ENCOUNTER — Ambulatory Visit: Admitting: Podiatry
